# Patient Record
Sex: Male | Born: 1935 | Race: Black or African American | Hispanic: No | Smoking: Never smoker
Health system: Southern US, Community
[De-identification: ages and names within clinical notes are randomized; demographics above are authoritative.]

## PROBLEM LIST (undated history)

## (undated) DIAGNOSIS — C189 Malignant neoplasm of colon, unspecified: Secondary | ICD-10-CM

## (undated) DIAGNOSIS — E7800 Pure hypercholesterolemia, unspecified: Secondary | ICD-10-CM

## (undated) DIAGNOSIS — M1990 Unspecified osteoarthritis, unspecified site: Secondary | ICD-10-CM

## (undated) DIAGNOSIS — I10 Essential (primary) hypertension: Secondary | ICD-10-CM

## (undated) DIAGNOSIS — I2699 Other pulmonary embolism without acute cor pulmonale: Secondary | ICD-10-CM

## (undated) DIAGNOSIS — M889 Osteitis deformans of unspecified bone: Secondary | ICD-10-CM

## (undated) HISTORY — DX: Malignant neoplasm of colon, unspecified: C18.9

## (undated) HISTORY — PX: APPENDECTOMY: SHX54

## (undated) HISTORY — DX: Osteitis deformans of unspecified bone: M88.9

## (undated) HISTORY — PX: KELOID EXCISION: SHX1856

## (undated) HISTORY — DX: Essential (primary) hypertension: I10

## (undated) HISTORY — DX: Other pulmonary embolism without acute cor pulmonale: I26.99

## (undated) HISTORY — DX: Pure hypercholesterolemia, unspecified: E78.00

---

## 1992-12-09 HISTORY — PX: HEMORRHOID SURGERY: SHX153

## 2000-03-27 ENCOUNTER — Ambulatory Visit (HOSPITAL_COMMUNITY): Admission: RE | Admit: 2000-03-27 | Discharge: 2000-03-27 | Payer: Self-pay | Admitting: Surgery

## 2000-03-27 ENCOUNTER — Encounter: Payer: Self-pay | Admitting: Surgery

## 2000-08-21 ENCOUNTER — Ambulatory Visit (HOSPITAL_COMMUNITY): Admission: RE | Admit: 2000-08-21 | Discharge: 2000-08-21 | Payer: Self-pay | Admitting: Surgery

## 2000-08-21 ENCOUNTER — Encounter: Payer: Self-pay | Admitting: Surgery

## 2001-02-19 ENCOUNTER — Encounter: Payer: Self-pay | Admitting: Surgery

## 2001-02-19 ENCOUNTER — Ambulatory Visit (HOSPITAL_COMMUNITY): Admission: RE | Admit: 2001-02-19 | Discharge: 2001-02-19 | Payer: Self-pay | Admitting: Surgery

## 2010-06-14 ENCOUNTER — Ambulatory Visit (HOSPITAL_COMMUNITY): Admission: RE | Admit: 2010-06-14 | Discharge: 2010-06-14 | Payer: Self-pay | Admitting: Gastroenterology

## 2010-06-27 ENCOUNTER — Encounter: Admission: RE | Admit: 2010-06-27 | Discharge: 2010-06-27 | Payer: Self-pay | Admitting: General Surgery

## 2010-07-03 ENCOUNTER — Inpatient Hospital Stay (HOSPITAL_COMMUNITY): Admission: RE | Admit: 2010-07-03 | Discharge: 2010-07-11 | Payer: Self-pay | Admitting: General Surgery

## 2010-07-03 ENCOUNTER — Encounter (INDEPENDENT_AMBULATORY_CARE_PROVIDER_SITE_OTHER): Payer: Self-pay | Admitting: General Surgery

## 2010-07-03 HISTORY — PX: LEFT COLECTOMY: SHX856

## 2010-07-24 ENCOUNTER — Ambulatory Visit: Payer: Self-pay | Admitting: Hematology and Oncology

## 2010-07-27 ENCOUNTER — Inpatient Hospital Stay (HOSPITAL_COMMUNITY): Admission: EM | Admit: 2010-07-27 | Discharge: 2010-08-03 | Payer: Self-pay | Admitting: Emergency Medicine

## 2010-07-27 ENCOUNTER — Ambulatory Visit: Payer: Self-pay | Admitting: Internal Medicine

## 2010-08-17 LAB — COMPREHENSIVE METABOLIC PANEL
ALT: 18 U/L (ref 0–53)
AST: 18 U/L (ref 0–37)
Albumin: 4.1 g/dL (ref 3.5–5.2)
BUN: 10 mg/dL (ref 6–23)
CO2: 30 mEq/L (ref 19–32)
Calcium: 10.3 mg/dL (ref 8.4–10.5)
Chloride: 105 mEq/L (ref 96–112)
Potassium: 4.7 mEq/L (ref 3.5–5.3)
Total Protein: 7.4 g/dL (ref 6.0–8.3)

## 2010-08-17 LAB — CBC WITH DIFFERENTIAL/PLATELET
Basophils Absolute: 0 10*3/uL (ref 0.0–0.1)
EOS%: 1.2 % (ref 0.0–7.0)
HCT: 34.9 % — ABNORMAL LOW (ref 38.4–49.9)
LYMPH%: 29.7 % (ref 14.0–49.0)
NEUT#: 3.5 10*3/uL (ref 1.5–6.5)
NEUT%: 60 % (ref 39.0–75.0)
RBC: 3.97 10*6/uL — ABNORMAL LOW (ref 4.20–5.82)
RDW: 17.5 % — ABNORMAL HIGH (ref 11.0–14.6)
WBC: 5.9 10*3/uL (ref 4.0–10.3)
lymph#: 1.7 10*3/uL (ref 0.9–3.3)

## 2010-08-17 LAB — CEA: CEA: <0.5 ng/mL (ref 0.0–5.0)

## 2010-08-20 ENCOUNTER — Ambulatory Visit (HOSPITAL_COMMUNITY): Admission: RE | Admit: 2010-08-20 | Discharge: 2010-08-20 | Payer: Self-pay | Admitting: Hematology and Oncology

## 2010-09-13 ENCOUNTER — Ambulatory Visit (HOSPITAL_COMMUNITY): Admission: RE | Admit: 2010-09-13 | Discharge: 2010-09-13 | Payer: Self-pay | Admitting: Gastroenterology

## 2010-10-31 ENCOUNTER — Ambulatory Visit: Payer: Self-pay | Admitting: Hematology and Oncology

## 2010-11-02 ENCOUNTER — Ambulatory Visit (HOSPITAL_COMMUNITY)
Admission: RE | Admit: 2010-11-02 | Discharge: 2010-11-02 | Payer: Self-pay | Source: Home / Self Care | Admitting: Hematology and Oncology

## 2010-11-02 LAB — CBC WITH DIFFERENTIAL/PLATELET
Basophils Absolute: 0 10*3/uL (ref 0.0–0.1)
Eosinophils Absolute: 0 10*3/uL (ref 0.0–0.5)
HGB: 11.7 g/dL — ABNORMAL LOW (ref 13.0–17.1)
MCH: 27.9 pg (ref 27.2–33.4)
MCV: 84.3 fL (ref 79.3–98.0)
MONO#: 0.5 10*3/uL (ref 0.1–0.9)
NEUT#: 2.3 10*3/uL (ref 1.5–6.5)
Platelets: 252 10*3/uL (ref 140–400)

## 2010-11-02 LAB — COMPREHENSIVE METABOLIC PANEL WITH GFR
ALT: 17 U/L (ref 0–53)
AST: 19 U/L (ref 0–37)
Albumin: 4.3 g/dL (ref 3.5–5.2)
Alkaline Phosphatase: 144 U/L — ABNORMAL HIGH (ref 39–117)
BUN: 12 mg/dL (ref 6–23)
CO2: 28 meq/L (ref 19–32)
Calcium: 10 mg/dL (ref 8.4–10.5)
Chloride: 102 meq/L (ref 96–112)
Creatinine, Ser: 1 mg/dL (ref 0.40–1.50)
Glucose, Bld: 91 mg/dL (ref 70–99)
Potassium: 4.5 meq/L (ref 3.5–5.3)
Sodium: 140 meq/L (ref 135–145)
Total Bilirubin: 0.6 mg/dL (ref 0.3–1.2)
Total Protein: 7.5 g/dL (ref 6.0–8.3)

## 2010-11-02 LAB — CEA: CEA: <0.5 ng/mL (ref 0.0–5.0)

## 2010-11-13 LAB — SPEP & IFE WITH QIG
Alpha-2-Globulin: 8 % (ref 7.1–11.8)
Gamma Globulin: 13.7 % (ref 11.1–18.8)
IgA: 395 mg/dL — ABNORMAL HIGH (ref 68–378)
IgM, Serum: 48 mg/dL — ABNORMAL LOW (ref 60–263)

## 2010-11-15 LAB — KAPPA/LAMBDA LIGHT CHAINS: Kappa:Lambda Ratio: 0.51 (ref 0.26–1.65)

## 2010-11-23 ENCOUNTER — Ambulatory Visit (HOSPITAL_COMMUNITY)
Admission: RE | Admit: 2010-11-23 | Discharge: 2010-11-23 | Payer: Self-pay | Source: Home / Self Care | Attending: Hematology and Oncology | Admitting: Hematology and Oncology

## 2010-12-26 ENCOUNTER — Ambulatory Visit: Payer: Self-pay | Admitting: Hematology and Oncology

## 2010-12-28 ENCOUNTER — Ambulatory Visit (HOSPITAL_COMMUNITY)
Admission: RE | Admit: 2010-12-28 | Discharge: 2010-12-28 | Payer: Self-pay | Source: Home / Self Care | Attending: Hematology and Oncology | Admitting: Hematology and Oncology

## 2010-12-31 LAB — CREATININE, SERUM
Creatinine, Ser: 0.99 mg/dL (ref 0.4–1.5)
GFR calc Af Amer: >60 mL/min (ref >60)

## 2011-02-22 LAB — PROTIME-INR
INR: 1.47 (ref 0.00–1.49)
INR: 2.31 — ABNORMAL HIGH (ref 0.00–1.49)
Prothrombin Time: 15.5 seconds — ABNORMAL HIGH (ref 11.6–15.2)
Prothrombin Time: 16.1 seconds — ABNORMAL HIGH (ref 11.6–15.2)
Prothrombin Time: 18 seconds — ABNORMAL HIGH (ref 11.6–15.2)

## 2011-02-22 LAB — DIFFERENTIAL
Basophils Absolute: 0 10*3/uL (ref 0.0–0.1)
Basophils Relative: 0 % (ref 0–1)
Eosinophils Relative: 0 % (ref 0–5)
Lymphocytes Relative: 10 % — ABNORMAL LOW (ref 12–46)
Monocytes Relative: 8 % (ref 3–12)
Neutrophils Relative %: 82 % — ABNORMAL HIGH (ref 43–77)

## 2011-02-22 LAB — CULTURE, BLOOD (ROUTINE X 2)
Culture: NO GROWTH
Culture: NO GROWTH

## 2011-02-22 LAB — COMPREHENSIVE METABOLIC PANEL
ALT: 22 U/L (ref 0–53)
AST: 22 U/L (ref 0–37)
Albumin: 3.9 g/dL (ref 3.5–5.2)
Alkaline Phosphatase: 138 U/L — ABNORMAL HIGH (ref 39–117)
GFR calc Af Amer: 55 mL/min — ABNORMAL LOW (ref >60)
GFR calc non Af Amer: 45 mL/min — ABNORMAL LOW (ref >60)
Potassium: 4.4 mEq/L (ref 3.5–5.1)
Total Bilirubin: 1.2 mg/dL (ref 0.3–1.2)

## 2011-02-22 LAB — CBC
HCT: 23.6 % — ABNORMAL LOW (ref 39.0–52.0)
Hemoglobin: 10.5 g/dL — ABNORMAL LOW (ref 13.0–17.0)
Hemoglobin: 11.3 g/dL — ABNORMAL LOW (ref 13.0–17.0)
Hemoglobin: 7.9 g/dL — ABNORMAL LOW (ref 13.0–17.0)
MCH: 29.3 pg (ref 26.0–34.0)
MCH: 29.6 pg (ref 26.0–34.0)
MCV: 89.4 fL (ref 78.0–100.0)
Platelets: 295 10*3/uL (ref 150–400)
Platelets: 315 10*3/uL (ref 150–400)
RBC: 3.79 MIL/uL — ABNORMAL LOW (ref 4.22–5.81)
RDW: 16.2 % — ABNORMAL HIGH (ref 11.5–15.5)
WBC: 11.3 10*3/uL — ABNORMAL HIGH (ref 4.0–10.5)
WBC: 13.2 10*3/uL — ABNORMAL HIGH (ref 4.0–10.5)
WBC: 6.2 10*3/uL (ref 4.0–10.5)

## 2011-02-22 LAB — LACTIC ACID, PLASMA: Lactic Acid, Venous: 1.1 mmol/L (ref 0.5–2.2)

## 2011-02-22 LAB — BASIC METABOLIC PANEL
Calcium: 9.9 mg/dL (ref 8.4–10.5)
Chloride: 102 mEq/L (ref 96–112)
Creatinine, Ser: 1.24 mg/dL (ref 0.4–1.5)
GFR calc Af Amer: >60 mL/min (ref >60)
GFR calc non Af Amer: 57 mL/min — ABNORMAL LOW (ref >60)
Potassium: 4.4 mEq/L (ref 3.5–5.1)
Sodium: 137 mEq/L (ref 135–145)

## 2011-02-23 LAB — CBC
HCT: 25.9 % — ABNORMAL LOW (ref 39.0–52.0)
HCT: 27.4 % — ABNORMAL LOW (ref 39.0–52.0)
Hemoglobin: 11.2 g/dL — ABNORMAL LOW (ref 13.0–17.0)
Hemoglobin: 8.5 g/dL — ABNORMAL LOW (ref 13.0–17.0)
Hemoglobin: 9.2 g/dL — ABNORMAL LOW (ref 13.0–17.0)
MCH: 30 pg (ref 26.0–34.0)
MCH: 30.3 pg (ref 26.0–34.0)
MCHC: 33 g/dL (ref 30.0–36.0)
MCHC: 33.4 g/dL (ref 30.0–36.0)
MCV: 88.9 fL (ref 78.0–100.0)
MCV: 90.6 fL (ref 78.0–100.0)
Platelets: 250 10*3/uL (ref 150–400)
Platelets: 272 10*3/uL (ref 150–400)
RBC: 2.86 MIL/uL — ABNORMAL LOW (ref 4.22–5.81)
RBC: 3.2 MIL/uL — ABNORMAL LOW (ref 4.22–5.81)
RBC: 3.75 MIL/uL — ABNORMAL LOW (ref 4.22–5.81)
RDW: 14.9 % (ref 11.5–15.5)
RDW: 15.1 % (ref 11.5–15.5)
RDW: 15.1 % (ref 11.5–15.5)
WBC: 11.1 10*3/uL — ABNORMAL HIGH (ref 4.0–10.5)
WBC: 11.4 10*3/uL — ABNORMAL HIGH (ref 4.0–10.5)
WBC: 12.6 10*3/uL — ABNORMAL HIGH (ref 4.0–10.5)

## 2011-02-23 LAB — DIFFERENTIAL
Basophils Absolute: 0 10*3/uL (ref 0.0–0.1)
Basophils Absolute: 0.1 10*3/uL (ref 0.0–0.1)
Basophils Relative: 1 % (ref 0–1)
Basophils Relative: 1 % (ref 0–1)
Eosinophils Absolute: 0 10*3/uL (ref 0.0–0.7)
Eosinophils Absolute: 0.1 10*3/uL (ref 0.0–0.7)
Eosinophils Relative: 0 % (ref 0–5)
Eosinophils Relative: 2 % (ref 0–5)
Monocytes Absolute: 0.5 10*3/uL (ref 0.1–1.0)
Neutro Abs: 3.5 10*3/uL (ref 1.7–7.7)

## 2011-02-23 LAB — COMPREHENSIVE METABOLIC PANEL
Albumin: 4 g/dL (ref 3.5–5.2)
Alkaline Phosphatase: 158 U/L — ABNORMAL HIGH (ref 39–117)
BUN: 16 mg/dL (ref 6–23)
Chloride: 103 mEq/L (ref 96–112)
Glucose, Bld: 103 mg/dL — ABNORMAL HIGH (ref 70–99)
Potassium: 4.3 mEq/L (ref 3.5–5.1)
Total Bilirubin: 0.4 mg/dL (ref 0.3–1.2)

## 2011-02-23 LAB — BASIC METABOLIC PANEL
BUN: 8 mg/dL (ref 6–23)
CO2: 29 mEq/L (ref 19–32)
Calcium: 9.1 mg/dL (ref 8.4–10.5)
Calcium: 9.6 mg/dL (ref 8.4–10.5)
Creatinine, Ser: 1.23 mg/dL (ref 0.4–1.5)
GFR calc Af Amer: >60 mL/min (ref >60)
GFR calc Af Amer: >60 mL/min (ref >60)
GFR calc non Af Amer: >60 mL/min (ref >60)
Glucose, Bld: 130 mg/dL — ABNORMAL HIGH (ref 70–99)
Potassium: 4.2 mEq/L (ref 3.5–5.1)
Sodium: 139 mEq/L (ref 135–145)

## 2011-02-23 LAB — CEA: CEA: 0.8 ng/mL (ref 0.0–5.0)

## 2011-06-28 ENCOUNTER — Other Ambulatory Visit: Payer: Self-pay | Admitting: Hematology and Oncology

## 2011-06-28 ENCOUNTER — Encounter (HOSPITAL_BASED_OUTPATIENT_CLINIC_OR_DEPARTMENT_OTHER): Payer: Medicare Other | Admitting: Hematology and Oncology

## 2011-06-28 DIAGNOSIS — Z86718 Personal history of other venous thrombosis and embolism: Secondary | ICD-10-CM

## 2011-06-28 DIAGNOSIS — C189 Malignant neoplasm of colon, unspecified: Secondary | ICD-10-CM

## 2011-06-28 DIAGNOSIS — C186 Malignant neoplasm of descending colon: Secondary | ICD-10-CM

## 2011-06-28 DIAGNOSIS — Z7901 Long term (current) use of anticoagulants: Secondary | ICD-10-CM

## 2011-06-28 LAB — CBC WITH DIFFERENTIAL/PLATELET
Basophils Absolute: 0 10*3/uL (ref 0.0–0.1)
Eosinophils Absolute: 0 10*3/uL (ref 0.0–0.5)
HCT: 40.6 % (ref 38.4–49.9)
LYMPH%: 31.6 % (ref 14.0–49.0)
MCV: 87.3 fL (ref 79.3–98.0)
MONO#: 0.5 10*3/uL (ref 0.1–0.9)
MONO%: 6.6 % (ref 0.0–14.0)
NEUT#: 4.2 10*3/uL (ref 1.5–6.5)
NEUT%: 61.1 % (ref 39.0–75.0)
Platelets: 217 10*3/uL (ref 140–400)
RBC: 4.65 10*6/uL (ref 4.20–5.82)
WBC: 6.8 10*3/uL (ref 4.0–10.3)

## 2011-06-28 LAB — COMPREHENSIVE METABOLIC PANEL
Alkaline Phosphatase: 329 U/L — ABNORMAL HIGH (ref 39–117)
BUN: 12 mg/dL (ref 6–23)
CO2: 27 mEq/L (ref 19–32)
Creatinine, Ser: 1.04 mg/dL (ref 0.50–1.35)
Glucose, Bld: 118 mg/dL — ABNORMAL HIGH (ref 70–99)
Sodium: 143 mEq/L (ref 135–145)
Total Bilirubin: 0.3 mg/dL (ref 0.3–1.2)
Total Protein: 7.4 g/dL (ref 6.0–8.3)

## 2011-06-28 LAB — CEA: CEA: 1 ng/mL (ref 0.0–5.0)

## 2011-07-03 ENCOUNTER — Encounter (HOSPITAL_BASED_OUTPATIENT_CLINIC_OR_DEPARTMENT_OTHER): Payer: Medicare Other | Admitting: Hematology and Oncology

## 2011-07-03 DIAGNOSIS — M889 Osteitis deformans of unspecified bone: Secondary | ICD-10-CM

## 2011-07-03 DIAGNOSIS — D481 Neoplasm of uncertain behavior of connective and other soft tissue: Secondary | ICD-10-CM

## 2011-07-03 DIAGNOSIS — C186 Malignant neoplasm of descending colon: Secondary | ICD-10-CM

## 2011-12-07 IMAGING — CT CT ABD-PELV W/ CM
2 of 5 series · 13 of 32 positions shown, 18 images · IV contrast (READICAT & [ID] OMNI 300)
Comparison: None.

CLINICAL DATA: New diagnosis of descending colon cancer recent
colonoscopy.  Weight loss.

CT ABDOMEN AND PELVIS WITH CONTRAST
TECHNIQUE: Multidetector CT imaging of the abdomen and pelvis was
performed following the standard protocol during bolus
administration of intravenous contrast.
Contrast: 100 ml Emnipaque-R55 intravenously. BUN and creatinine
were obtained on site.  Results:  BUN 20 mg/dL,  Creatinine
mg/dL.

[Series 2: abdomen w/ · axial · 0.74mm/px · z∈[-244,+16]mm · 5 of 79 slices shown, 10 images]
[im 14/79  soft-tissue]
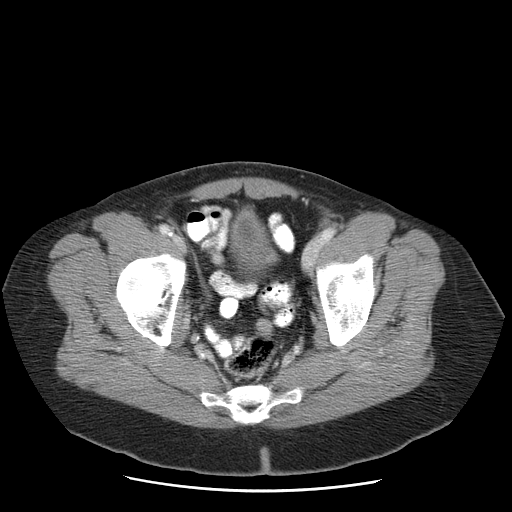
[im 14/79  bone]
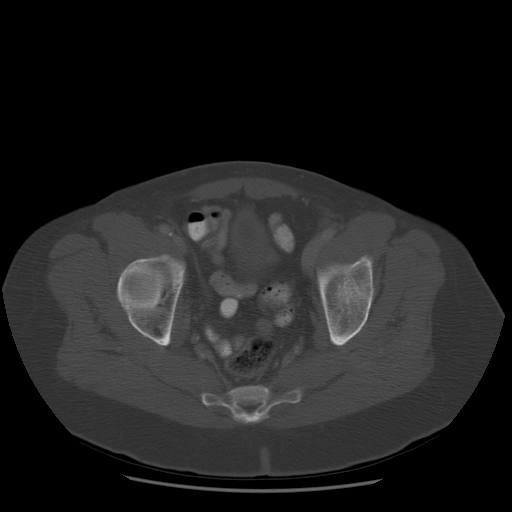
[im 27/79  soft-tissue]
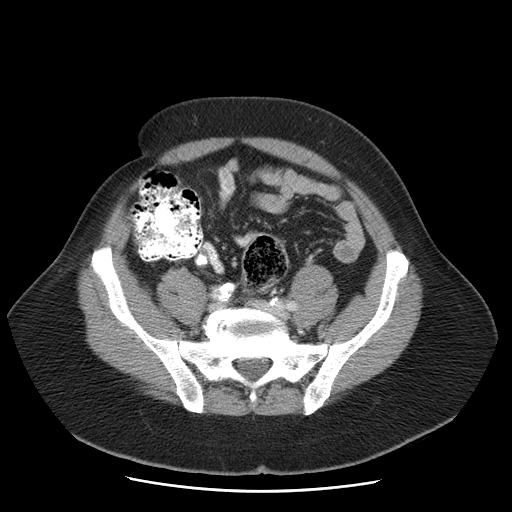
[im 27/79  lung]
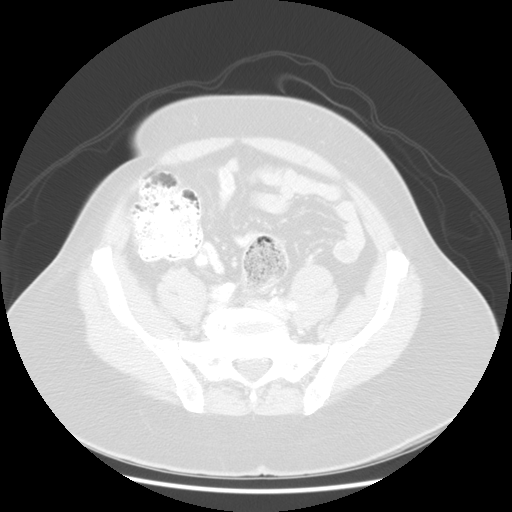
[im 40/79  soft-tissue]
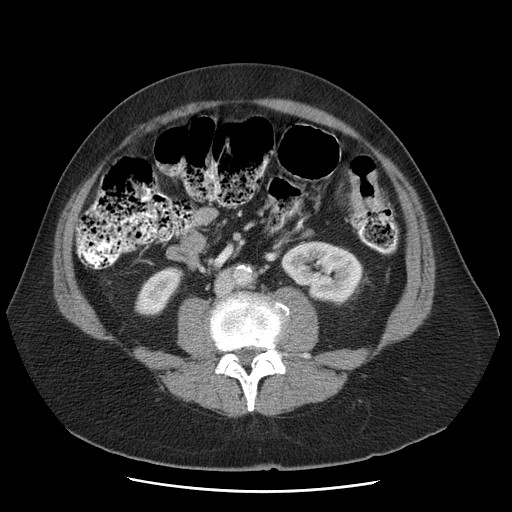
[im 40/79  lung]
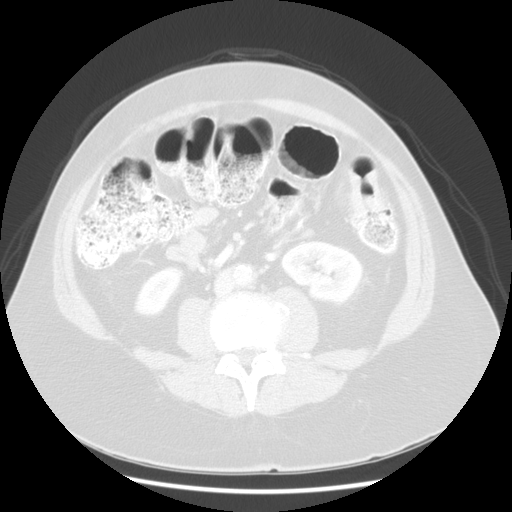
[im 53/79  soft-tissue]
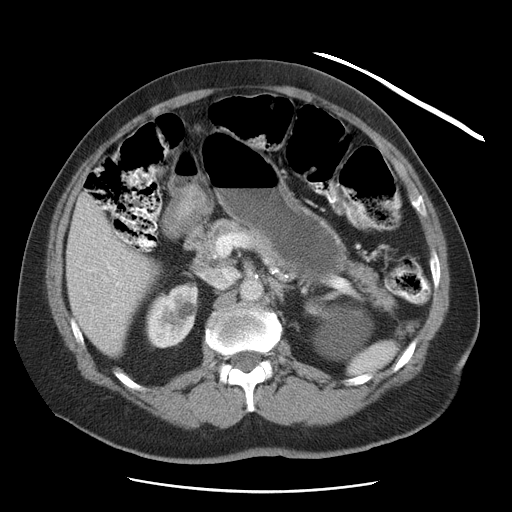
[im 53/79  lung]
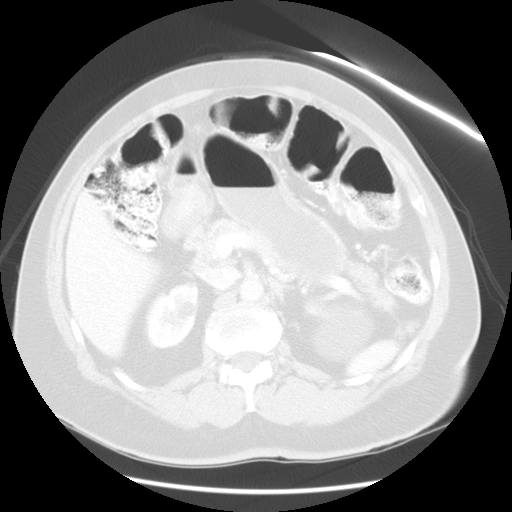
[im 66/79  soft-tissue]
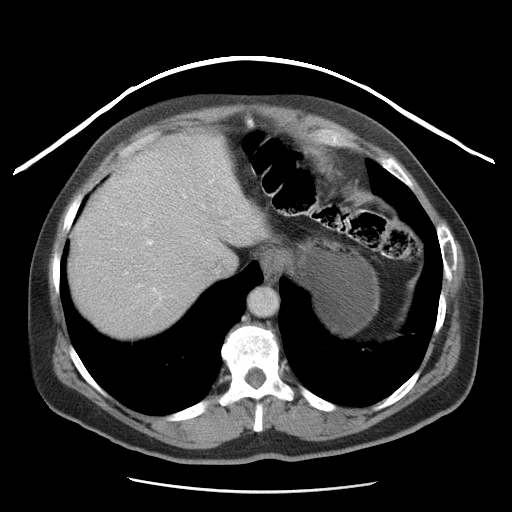
[im 66/79  lung]
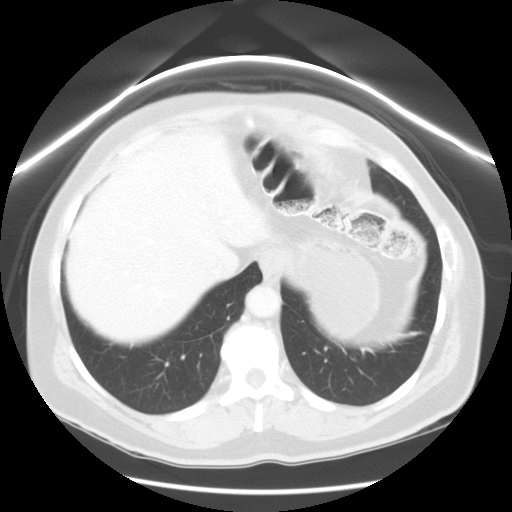

[Series 400: sagittal · sagittal · 0.88mm/px · 8 of 146 slices shown]
[im 15/146  soft-tissue]
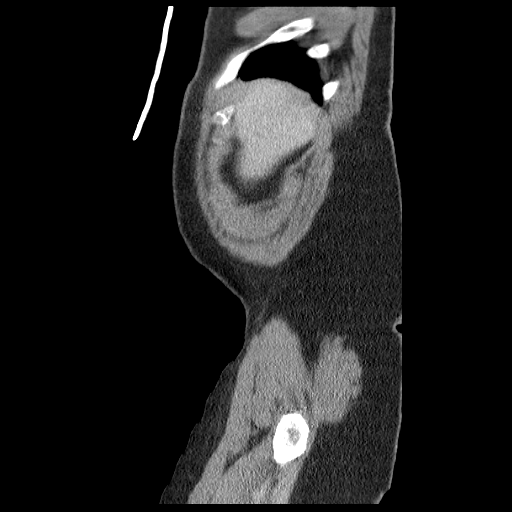
[im 30/146  soft-tissue]
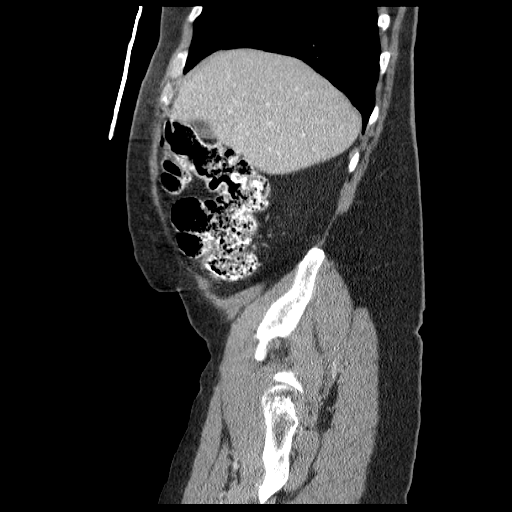
[im 44/146  soft-tissue]
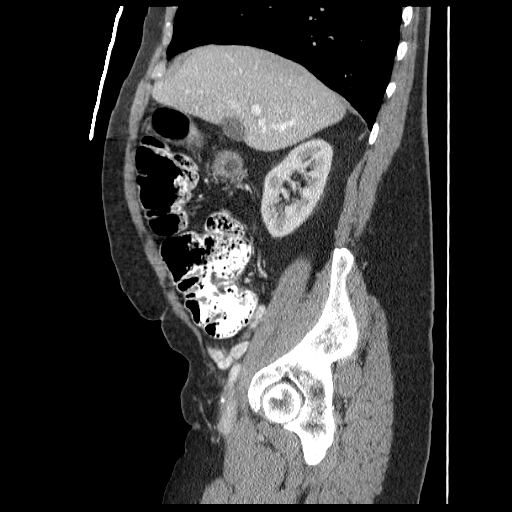
[im 59/146  soft-tissue]
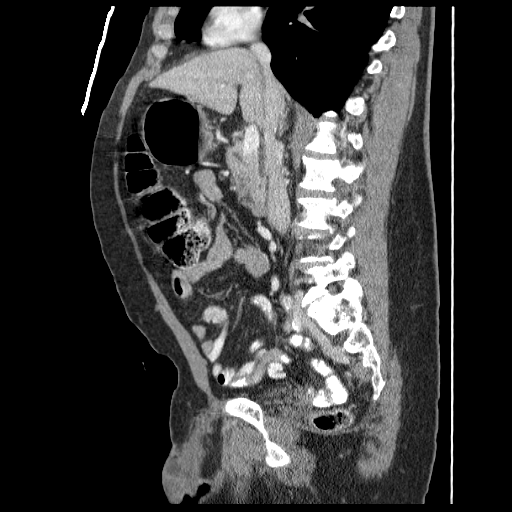
[im 88/146  soft-tissue]
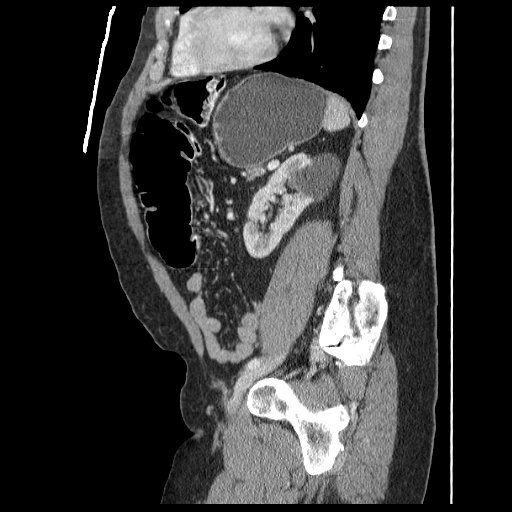
[im 102/146  soft-tissue]
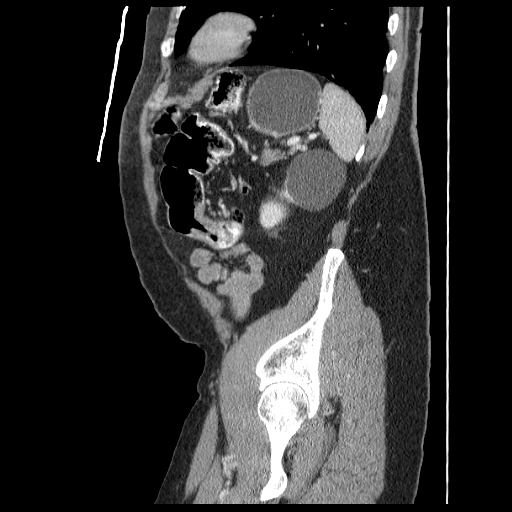
[im 117/146  soft-tissue]
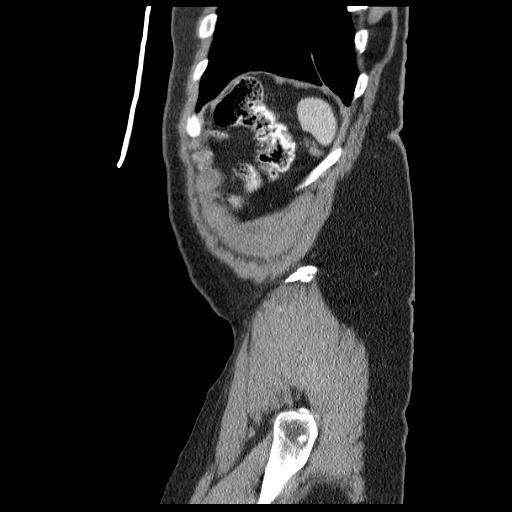
[im 131/146  soft-tissue]
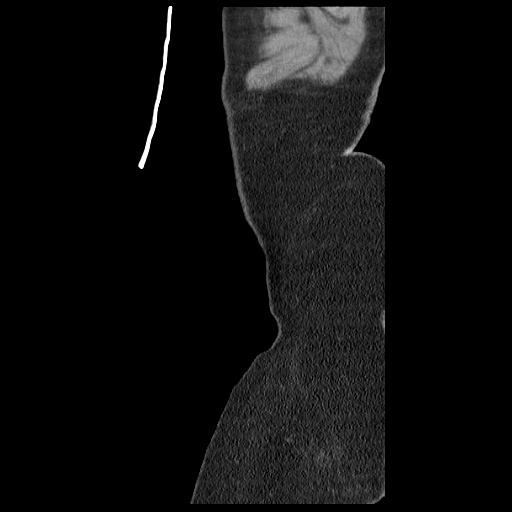

[13 of 32 positions shown; findings below may reference images not displayed]

FINDINGS: The lung bases are clear.  There is no pleural effusion.
The liver, spleen, gallbladder, pancreas and adrenal glands appear
normal.

There are bilateral renal cysts, measuring up to 6.0 cm in the
upper pole of the left kidney.  There are no enhancing renal
masses.

The colon is quite redundant.  The sigmoid colon extends into the
left upper quadrant of the abdomen.  The descending colon is
relatively short.  Near the junction of the descending and sigmoid
colon, there is circumferential colonic wall thickening consistent
with colon cancer.  This is seen on axial images 37 - 42.  Small
adjacent lymph nodes do not appear enlarged.  There is moderate
stool and mild distension proximal to this lesion.  No other focal
colonic lesions are identified.

There is possible gastric antral and proximal duodenal wall
thickening on images 26 - 29.  The ligament of Treitz is atypically
positioned in the right mid abdomen.  The cecum is normally
positioned.  There is no evidence of volvulus.

The urinary bladder demonstrates mild wall thickening.  The
prostate gland does not appear significantly enlarged.

There is diffuse irregular sclerosis throughout the lumbar spine
and sacrum with scattered lucencies in the spine.  The sacral
trabecula appear thickened.  There is a peripherally sclerotic
lesion in the left iliac bone on image 56.
IMPRESSION: 1.  Colon cancer near the junction of the descending and sigmoid
colon as described.
2.  No typical metastases are identified.  Specifically, there is
no evidence of hepatic metastatic disease or lymphadenopathy.
3.  Atypical mixed sclerosis and lucency in the spine with
trabecular thickening in the sacrum.  This would be atypical for
metastatic disease and may represent fibrous dysplasia or Paget's
disease.  Correlate clinically.
4.  Possible mucosal thickening at the gastric antrum and proximal
duodenum.  This is nonspecific in etiology.
5.  Redundant colon with apparent incomplete small bowel rotation.
6.  Bladder wall thickening.

## 2011-12-09 ENCOUNTER — Telehealth: Payer: Self-pay | Admitting: Hematology and Oncology

## 2011-12-09 ENCOUNTER — Other Ambulatory Visit: Payer: Self-pay | Admitting: Hematology and Oncology

## 2011-12-09 DIAGNOSIS — Z86718 Personal history of other venous thrombosis and embolism: Secondary | ICD-10-CM

## 2011-12-09 NOTE — Telephone Encounter (Signed)
Talked to pt, he asked me to call his daughter. Called [facility], left message for appt on 1/28 lab and Ct, MD visit on 01/10/12. Pt also instructed to pick up oral contrast before CT.

## 2011-12-31 ENCOUNTER — Telehealth: Payer: Self-pay | Admitting: Hematology and Oncology

## 2011-12-31 NOTE — Telephone Encounter (Signed)
pts daugter came by and p/u contrast for CT scan

## 2012-01-06 ENCOUNTER — Other Ambulatory Visit (HOSPITAL_BASED_OUTPATIENT_CLINIC_OR_DEPARTMENT_OTHER): Payer: Medicare Other | Admitting: Lab

## 2012-01-06 ENCOUNTER — Encounter: Payer: Self-pay | Admitting: Nurse Practitioner

## 2012-01-06 ENCOUNTER — Ambulatory Visit (HOSPITAL_COMMUNITY)
Admission: RE | Admit: 2012-01-06 | Discharge: 2012-01-06 | Disposition: A | Discharge status: Home / Self Care | Payer: Medicare Other | Source: Ambulatory Visit | Attending: Hematology and Oncology | Admitting: Hematology and Oncology

## 2012-01-06 DIAGNOSIS — K8689 Other specified diseases of pancreas: Secondary | ICD-10-CM | POA: Insufficient documentation

## 2012-01-06 DIAGNOSIS — R109 Unspecified abdominal pain: Secondary | ICD-10-CM | POA: Insufficient documentation

## 2012-01-06 DIAGNOSIS — Z9049 Acquired absence of other specified parts of digestive tract: Secondary | ICD-10-CM | POA: Insufficient documentation

## 2012-01-06 DIAGNOSIS — M899 Disorder of bone, unspecified: Secondary | ICD-10-CM | POA: Insufficient documentation

## 2012-01-06 DIAGNOSIS — C189 Malignant neoplasm of colon, unspecified: Secondary | ICD-10-CM | POA: Insufficient documentation

## 2012-01-06 DIAGNOSIS — N289 Disorder of kidney and ureter, unspecified: Secondary | ICD-10-CM | POA: Insufficient documentation

## 2012-01-06 DIAGNOSIS — N281 Cyst of kidney, acquired: Secondary | ICD-10-CM | POA: Insufficient documentation

## 2012-01-06 DIAGNOSIS — K439 Ventral hernia without obstruction or gangrene: Secondary | ICD-10-CM | POA: Insufficient documentation

## 2012-01-06 DIAGNOSIS — Z86718 Personal history of other venous thrombosis and embolism: Secondary | ICD-10-CM

## 2012-01-06 DIAGNOSIS — Z86711 Personal history of pulmonary embolism: Secondary | ICD-10-CM | POA: Insufficient documentation

## 2012-01-06 LAB — CMP (CANCER CENTER ONLY)
ALT(SGPT): 25 U/L (ref 10–47)
AST: 28 U/L (ref 11–38)
Albumin: 4.2 g/dL (ref 3.3–5.5)
Calcium: 9.9 mg/dL (ref 8.0–10.3)
Chloride: 101 mEq/L (ref 98–108)
Potassium: 3.8 mEq/L (ref 3.3–4.7)

## 2012-01-06 LAB — CBC WITH DIFFERENTIAL/PLATELET
BASO%: 0.3 % (ref 0.0–2.0)
Eosinophils Absolute: 0 10*3/uL (ref 0.0–0.5)
HCT: 44.8 % (ref 38.4–49.9)
MCHC: 34 g/dL (ref 32.0–36.0)
MONO#: 0.5 10*3/uL (ref 0.1–0.9)
NEUT#: 2.7 10*3/uL (ref 1.5–6.5)
NEUT%: 51.1 % (ref 39.0–75.0)
Platelets: 180 10*3/uL (ref 140–400)
WBC: 5.3 10*3/uL (ref 4.0–10.3)
lymph#: 2.1 10*3/uL (ref 0.9–3.3)

## 2012-01-06 IMAGING — CR DG CHEST 2V
2 series · 2 of 2 positions shown · non-contrast
Comparison: Chest radiograph performed 06/27/2010

CLINICAL DATA: Shortness of breath, status post colonic surgery.

CHEST - 2 VIEW

[w chest pa]
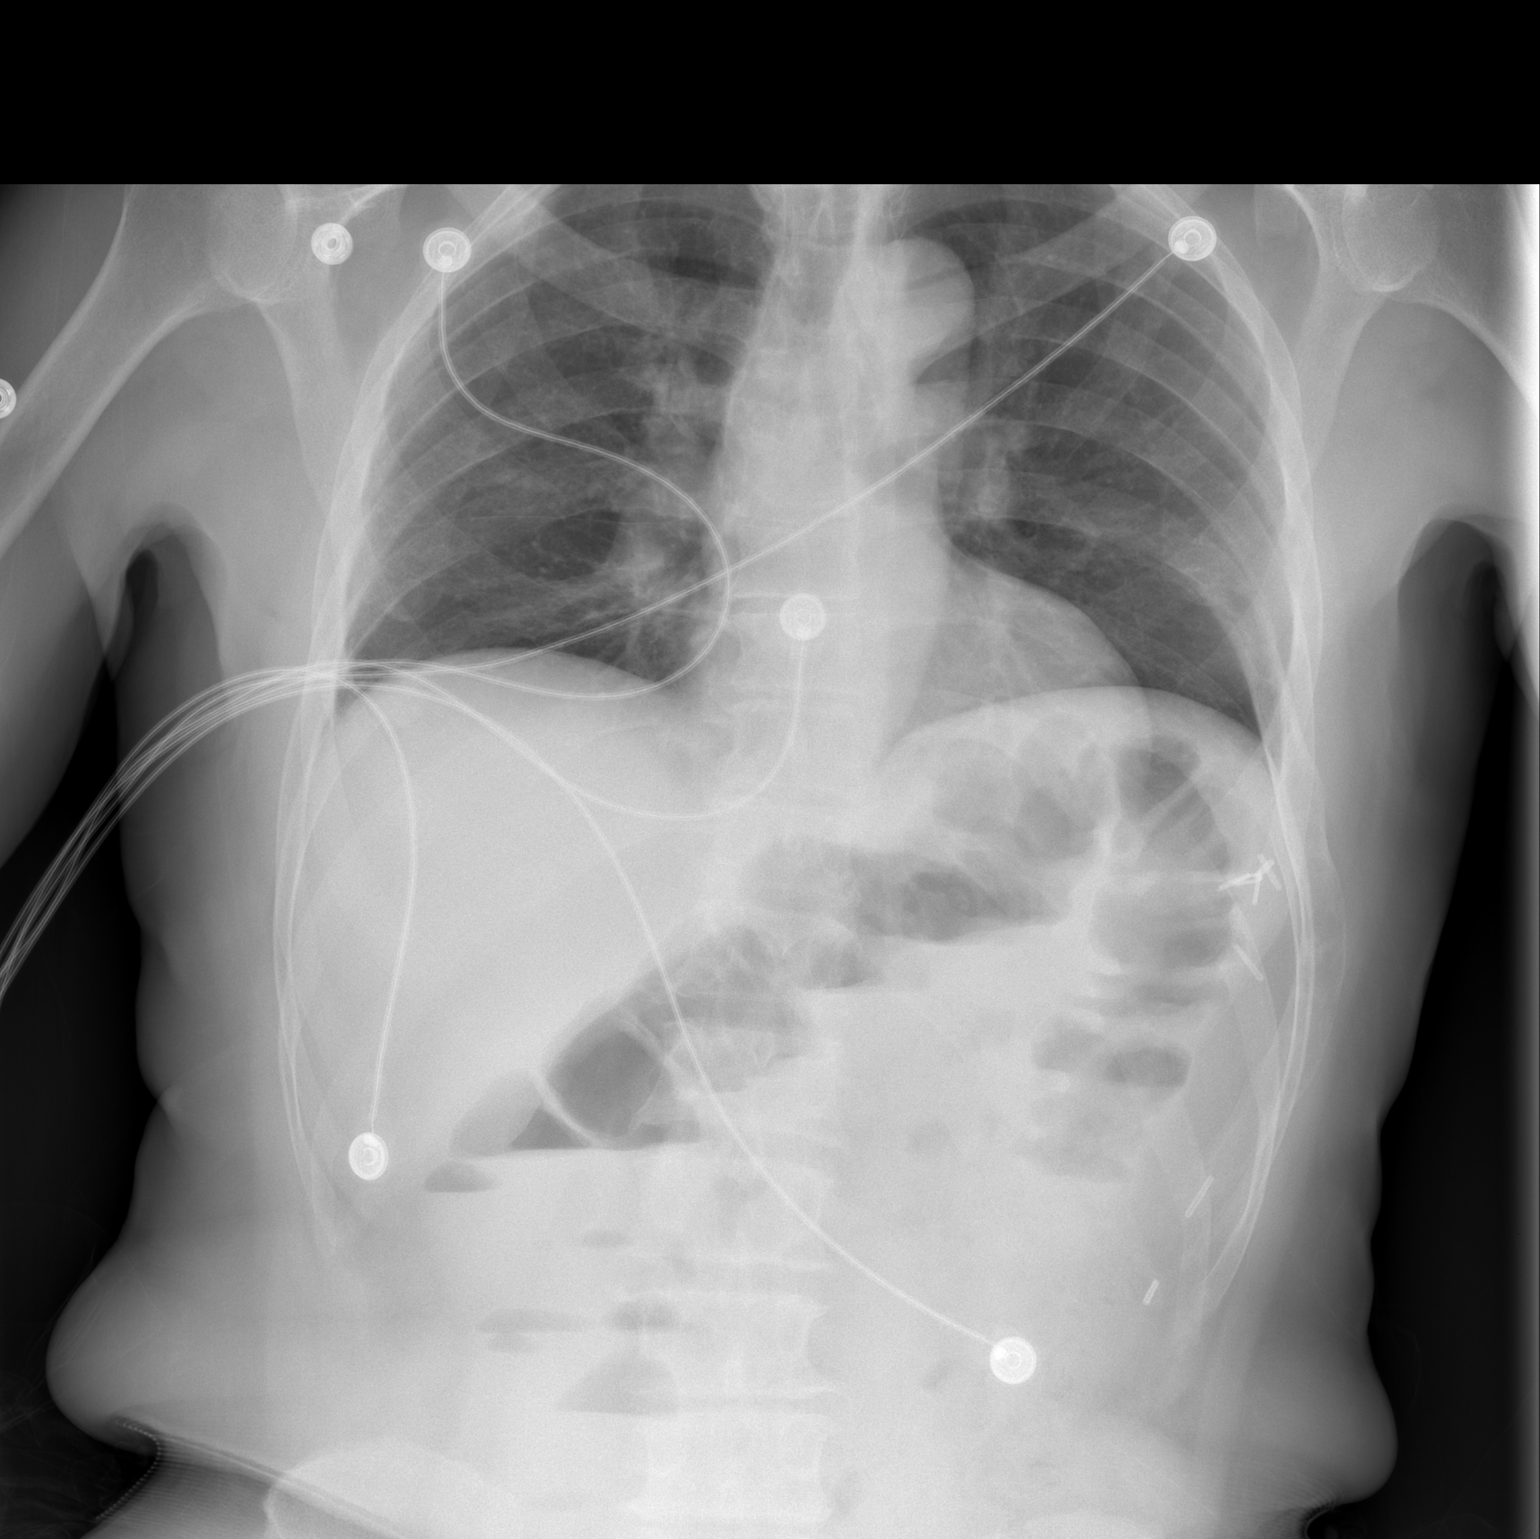

[w chest lat]
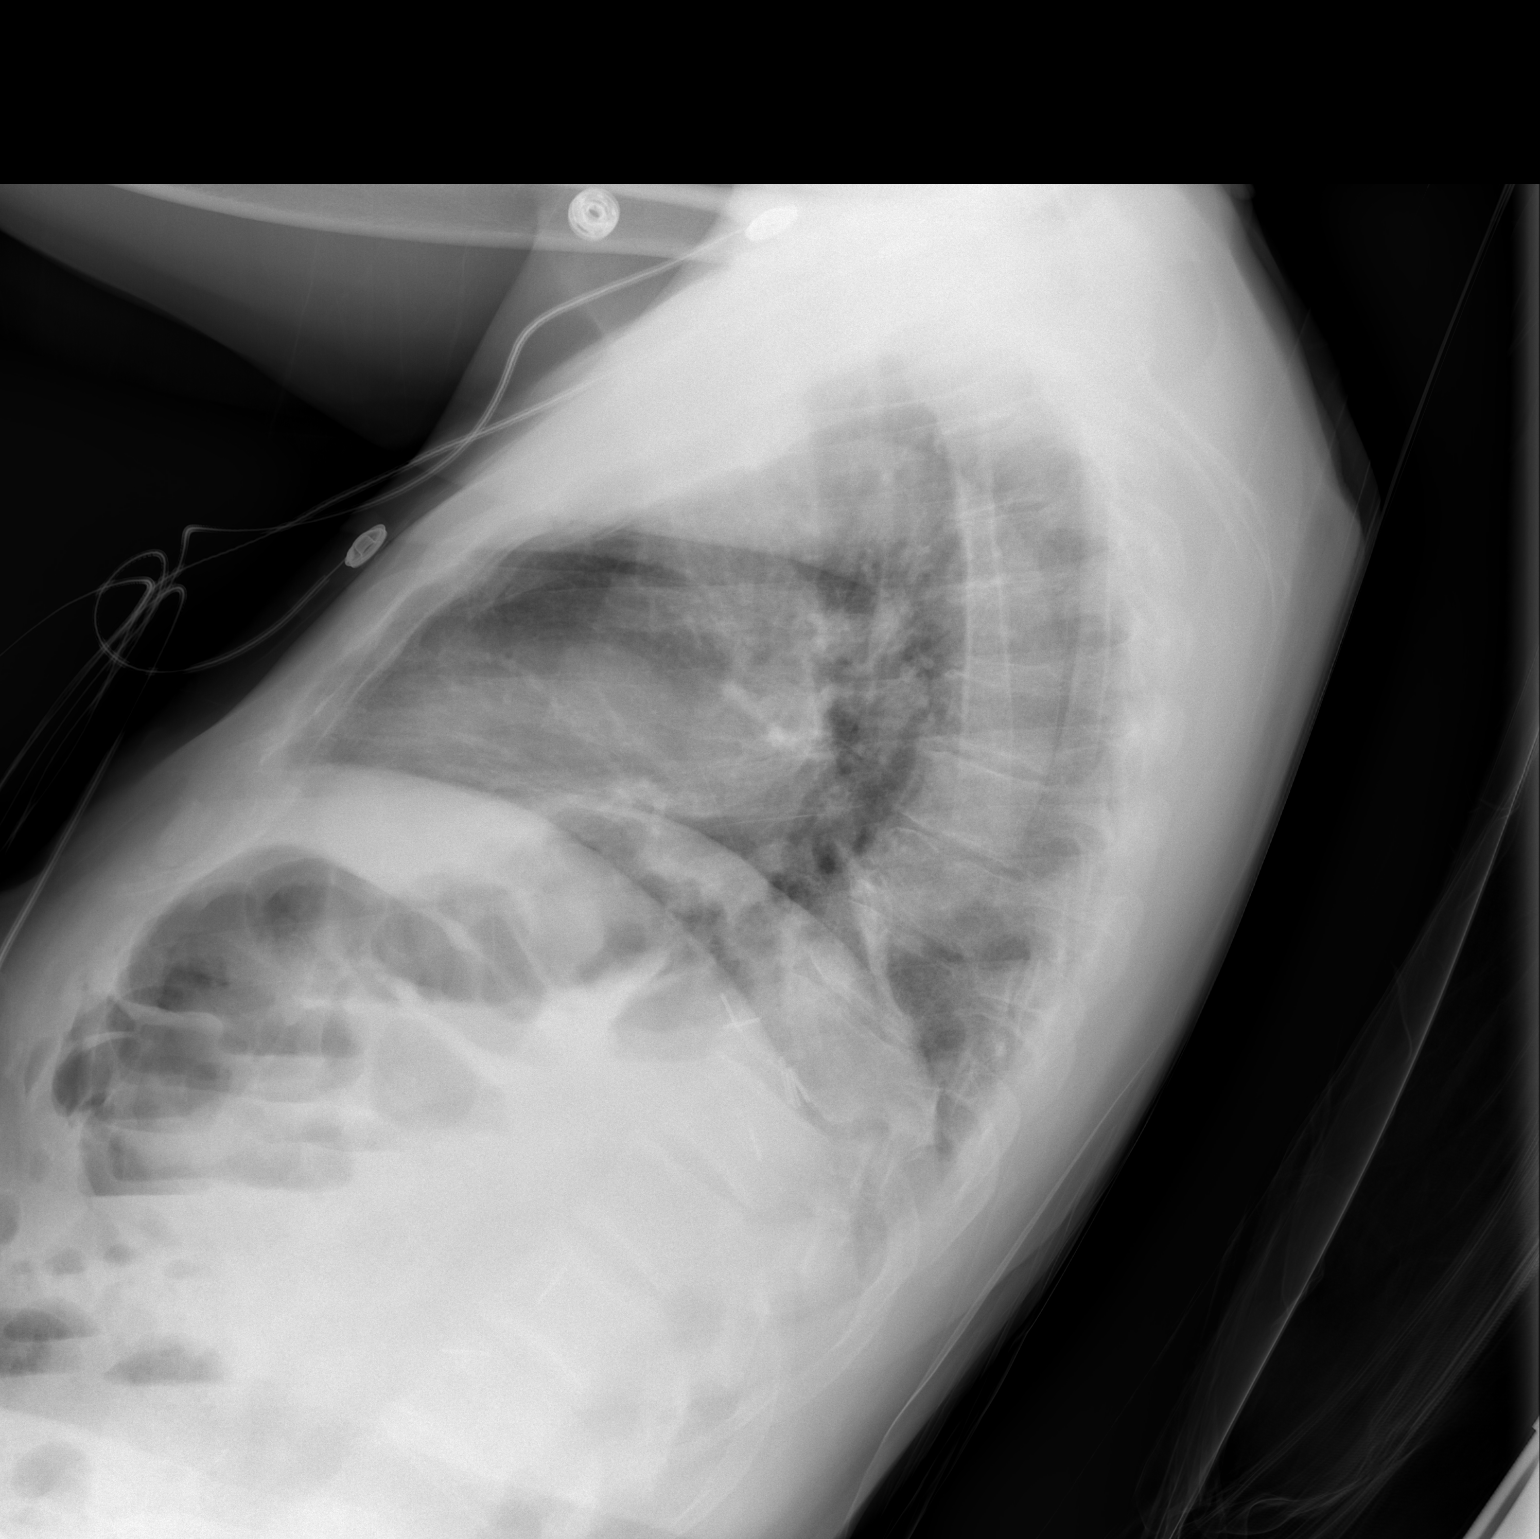

[2 of 2 positions shown; findings below may reference images not displayed]

FINDINGS: The lungs are hypoexpanded but appear essentially clear.
There is no evidence of focal opacification, pleural effusion or
pneumothorax.

The heart is normal in size; the mediastinal contour is within
normal limits.  No acute osseous abnormalities are seen. Clips are
noted on the left side of the abdomen.

Air and fluid are noted filling the colon; a few air-fluid levels
are noted, without evidence of bowel distension to suggest
obstruction.  This likely reflects mild postoperative dysmotility.
IMPRESSION: Hypoexpanded but clear lungs.

## 2012-01-06 MED ORDER — IOHEXOL 300 MG/ML  SOLN
100.0000 mL | Freq: Once | INTRAMUSCULAR | Status: AC | PRN
  Administered 2012-01-06: 100 mL via INTRAVENOUS

## 2012-01-10 ENCOUNTER — Telehealth: Payer: Self-pay | Admitting: Nurse Practitioner

## 2012-01-10 ENCOUNTER — Ambulatory Visit: Payer: Medicare Other | Admitting: Hematology and Oncology

## 2012-01-10 IMAGING — CR DG CHEST 1V PORT
1 series · 1 of 1 positions shown · non-contrast
Comparison: 07/27/2010.

CLINICAL DATA: Pulmonary embolus.  Shortness breath.

PORTABLE CHEST - 1 VIEW

[series 1]
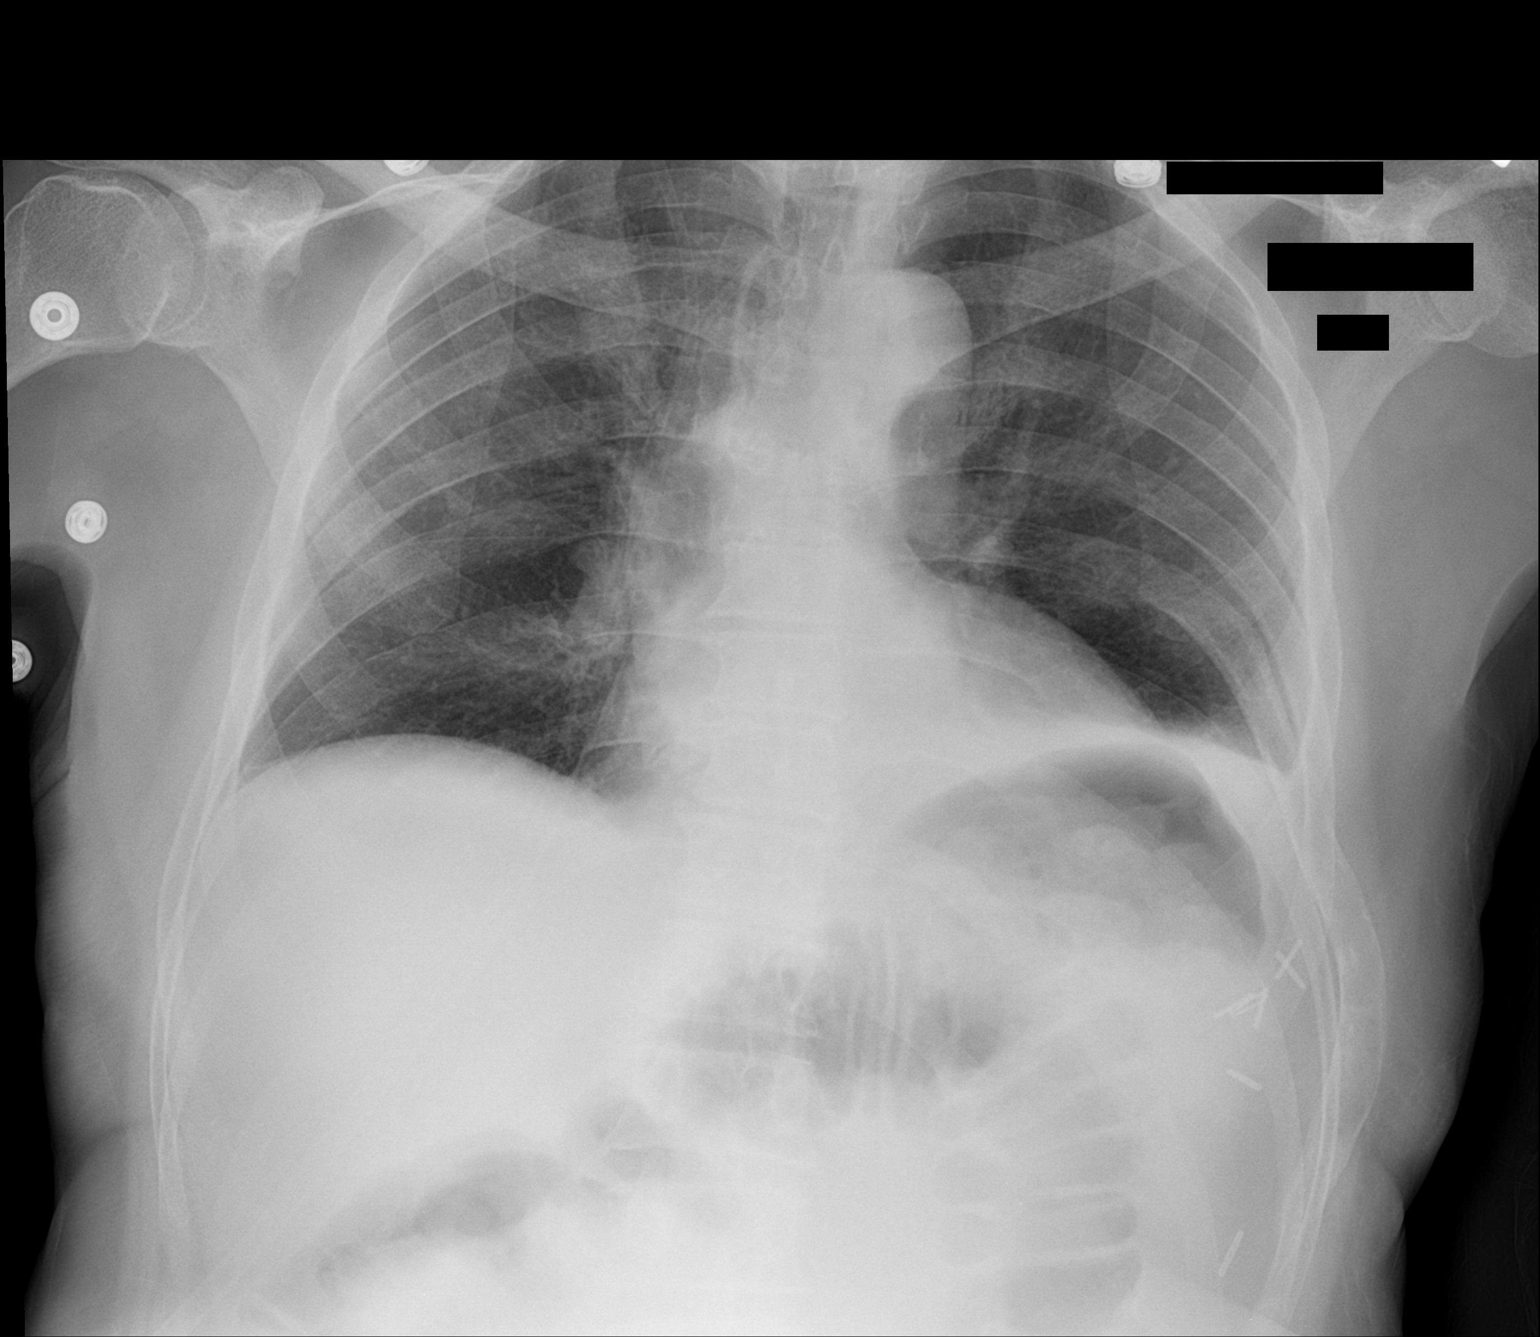

[1 of 1 positions shown; findings below may reference images not displayed]

FINDINGS: Persistent elevation left hemidiaphragm.  Small left-
sided pleural effusion cannot be excluded.

Mild central pulmonary vessel prominence.  No segmental infiltrate
or pneumothorax.  The CT detected region of parenchymal
infarction/atelectasis are not as well demonstrated on present
plain film exam.  Mildly tortuous aorta.

Gas distended bowel.
IMPRESSION: Persistent elevation left hemidiaphragm.  Small left-sided pleural
effusion cannot be excluded.

Mild central pulmonary vascular prominence.

Gas distended bowel.

## 2012-01-10 NOTE — Telephone Encounter (Signed)
Called patient to inform MD out of office today.  Requested return call to reschedule.

## 2012-01-10 NOTE — Telephone Encounter (Signed)
Spoke with patient's daughter.  R/S appt to Monday 2/4 at 0930.

## 2012-01-13 ENCOUNTER — Telehealth: Payer: Self-pay | Admitting: Hematology and Oncology

## 2012-01-13 ENCOUNTER — Ambulatory Visit (HOSPITAL_BASED_OUTPATIENT_CLINIC_OR_DEPARTMENT_OTHER): Payer: Medicare Other | Admitting: Hematology and Oncology

## 2012-01-13 VITALS — BP 158/85 | HR 65 | Temp 97.0°F | Ht 67.0 in | Wt 180.5 lb

## 2012-01-13 DIAGNOSIS — C189 Malignant neoplasm of colon, unspecified: Secondary | ICD-10-CM

## 2012-01-13 DIAGNOSIS — Z86711 Personal history of pulmonary embolism: Secondary | ICD-10-CM

## 2012-01-13 NOTE — Telephone Encounter (Signed)
appts made and printed for 07/21/12   aom

## 2012-01-13 NOTE — Progress Notes (Signed)
CC:   Pedro Palmer. Polite, M.D. Pedro Palmer. Pedro Palmer, M.D. Pedro Palmer, M.D.  IDENTIFYING STATEMENT:  The patient is a 78 year old man with colon cancer who presents for followup.  INTERVAL HISTORY:  Pedro Palmer was last seen in June.  He reports no concerns.  He notes his weight is stable.  He is without nausea, vomiting, abdominal pain, diarrhea or hematochezia.  His last colonoscopy was performed on 06/10/2011 by Dr. Danise Palmer which was unremarkable.  He received a CT scan of the abdomen and pelvis on 01/06/2012.  There was no acute evidence of metastatic disease within the abdomen and pelvis.  There was ventral abdominal wall hernia that contained no obstructive bowel.  Osseous findings consistent with the patient's known Paget's disease.  The patient continues on Coumadin for bilateral pulmonary emboli occurring following surgery.  MEDICATIONS:  Reviewed and updated.  ALLERGIES:  None.  Past medical history, family history and social history unchanged.  REVIEW OF SYSTEMS:  Ten point review of systems negative.  PHYSICAL EXAMINATION:  General:  The patient is a well-appearing, well- nourished man in no distress.  Vitals:  Pulse 65, blood pressure 150/85, temperature 97, respirations 20, weight 180 pounds.  HEENT:  Head is atraumatic, normocephalic.  Sclerae anicteric.  Mouth is moist.  Neck: Supple.  Chest:  Clear.  CVS:  Unremarkable.  Abdomen:  Soft, nontender. Bowel sounds present.  Extremities:  No edema.  No calf tenderness. Lymph nodes:  No adenopathy.  CNS:  Nonfocal.  LAB DATA:  On 01/06/2012, white cell count 5.3, hemoglobin 15.2, hematocrit 44.8, platelets 180.  Sodium 146, potassium 3.8, chloride 101, CO2 29, BUN 12, creatinine 2.8, glucose 107, T bilirubin 0.7, alkaline phosphatase 279, AST 28, ALT 25.  Calcium 9.9.  CEA 0.8. Results of CT of abdomen and pelvis as in interval history.  IMPRESSION AND PLAN:  Pedro Palmer is a 78 year old man with: 1. Status  post left colectomy 07/03/2010 for a 4.5 cm moderately     differentiated adenocarcinoma of the colon with 0 of 20 positive     lymph nodes.  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  The patient is currently undergoing surveillance.     His current exam and CTs indicate no evidence of recurrence.  He is     up-to-date with colonoscopy.  Pedro Palmer will follow up in 6     months' time with labs only. 2. 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     The patient is currently on Coumadin.  When he follows up in 6     months' time will re-evaluate the need for ongoing anticoagulation.   ______________________________ Pedro Palmer, M.D. LIO/MEDQ  D:  01/13/2012  T:  01/13/2012  Job:  [id number]

## 2012-01-13 NOTE — Progress Notes (Signed)
This office note has been dictated.

## 2012-07-17 ENCOUNTER — Other Ambulatory Visit (HOSPITAL_BASED_OUTPATIENT_CLINIC_OR_DEPARTMENT_OTHER): Payer: Medicare Other | Admitting: Lab

## 2012-07-17 DIAGNOSIS — C186 Malignant neoplasm of descending colon: Secondary | ICD-10-CM

## 2012-07-17 DIAGNOSIS — C189 Malignant neoplasm of colon, unspecified: Secondary | ICD-10-CM

## 2012-07-17 LAB — CBC WITH DIFFERENTIAL/PLATELET
BASO%: 0.4 % (ref 0.0–2.0)
Basophils Absolute: 0 10*3/uL (ref 0.0–0.1)
EOS%: 0.5 % (ref 0.0–7.0)
Eosinophils Absolute: 0 10*3/uL (ref 0.0–0.5)
HCT: 44.5 % (ref 38.4–49.9)
HGB: 14.8 g/dL (ref 13.0–17.1)
LYMPH%: 33.9 % (ref 14.0–49.0)
MCH: 32.9 pg (ref 27.2–33.4)
MCHC: 33.4 g/dL (ref 32.0–36.0)
MCV: 98.7 fL — ABNORMAL HIGH (ref 79.3–98.0)
MONO#: 0.5 10*3/uL (ref 0.1–0.9)
MONO%: 9.4 % (ref 0.0–14.0)
NEUT#: 2.9 10*3/uL (ref 1.5–6.5)
NEUT%: 55.8 % (ref 39.0–75.0)
Platelets: 156 10*3/uL (ref 140–400)
RBC: 4.51 10*6/uL (ref 4.20–5.82)
RDW: 15 % — ABNORMAL HIGH (ref 11.0–14.6)
WBC: 5.1 10*3/uL (ref 4.0–10.3)
lymph#: 1.7 10*3/uL (ref 0.9–3.3)

## 2012-07-17 LAB — COMPREHENSIVE METABOLIC PANEL
ALT: 10 U/L (ref 0–53)
AST: 17 U/L (ref 0–37)
Albumin: 4 g/dL (ref 3.5–5.2)
Alkaline Phosphatase: 284 U/L — ABNORMAL HIGH (ref 39–117)
BUN: 14 mg/dL (ref 6–23)
CO2: 31 mEq/L (ref 19–32)
Calcium: 10.2 mg/dL (ref 8.4–10.5)
Chloride: 107 mEq/L (ref 96–112)
Creatinine, Ser: 1.06 mg/dL (ref 0.50–1.35)
Glucose, Bld: 62 mg/dL — ABNORMAL LOW (ref 70–99)
Potassium: 3.9 mEq/L (ref 3.5–5.3)
Sodium: 144 mEq/L (ref 135–145)
Total Bilirubin: 0.4 mg/dL (ref 0.3–1.2)
Total Protein: 6.5 g/dL (ref 6.0–8.3)

## 2012-07-21 ENCOUNTER — Telehealth: Payer: Self-pay | Admitting: Hematology and Oncology

## 2012-07-21 ENCOUNTER — Ambulatory Visit (HOSPITAL_BASED_OUTPATIENT_CLINIC_OR_DEPARTMENT_OTHER): Payer: Medicare Other | Admitting: Hematology and Oncology

## 2012-07-21 ENCOUNTER — Encounter: Payer: Self-pay | Admitting: Hematology and Oncology

## 2012-07-21 VITALS — BP 168/79 | HR 66 | Temp 97.2°F | Resp 20 | Ht 67.0 in | Wt 178.6 lb

## 2012-07-21 DIAGNOSIS — C185 Malignant neoplasm of splenic flexure: Secondary | ICD-10-CM

## 2012-07-21 DIAGNOSIS — Z7901 Long term (current) use of anticoagulants: Secondary | ICD-10-CM

## 2012-07-21 DIAGNOSIS — Z86711 Personal history of pulmonary embolism: Secondary | ICD-10-CM

## 2012-07-21 DIAGNOSIS — C186 Malignant neoplasm of descending colon: Secondary | ICD-10-CM

## 2012-07-21 DIAGNOSIS — C189 Malignant neoplasm of colon, unspecified: Secondary | ICD-10-CM

## 2012-07-21 DIAGNOSIS — I10 Essential (primary) hypertension: Secondary | ICD-10-CM

## 2012-07-21 NOTE — Progress Notes (Signed)
CC:   Pedro Palmer. Polite, M.D. Anselm Pancoast. Zachery Dakins, M.D. Danise Edge, M.D.  IDENTIFYING STATEMENT:  The patient is a 78 year old man with colon cancer who presents for followup.  INTERVAL HISTORY:  Pedro Palmer was seen 6 months ago.  Since that time, he has had no issues or concerns.  His weight is stable.  He continues on Coumadin.  He has had no changes in his bowel function.  MEDICATIONS:  Reviewed and updated.  ALLERGIES:  None.  PAST MEDICAL HISTORY/FAMILY HISTORY/SOCIAL HISTORY:  Unchanged.  PHYSICAL EXAMINATION:  General:  The patient is a well-appearing, well- nourished man in no distress.  Vitals:  Pulse 66, blood pressure 168/79, temperature 97.2, respirations 20, weight 138.6 pounds.  HEENT:  Head is atraumatic, normocephalic.  Sclerae are anicteric.  Mouth moist.  Chest: Clear.  CVS:  Unremarkable.  Abdomen:  Soft, nontender.  Bowel sounds present.  Extremities:  No edema or calf tenderness.  LABORATORY DATA:  07/17/2012 white cell count 5.1, hemoglobin 14.8, hematocrit 44.5, platelets 156.  Sodium 144, potassium 3.9, chloride 107, CO2 of 31, BUN 14, creatinine 1.06, glucose 62, T-bili 0.4, alkaline phosphatase 284, AST 17, ALT 10, calcium 10.2.  CEA 0.6.  IMPRESSION AND PLAN:  Pedro Palmer is a 78 year old man with: 1. Status post left colectomy on 07/03/2010 for a 4.5-cm moderately-     differentiated adenocarcinoma of the colon with 0 of 20 positive     lymph nodes.  He also has a low-grade GIST tumor that was also     excised.  Chemotherapy was not offered and he is currently     undergoing surveillance.  His current exam and lab work indicate no     evidence of recurrence.  He is also up-to-date with colonoscopy,     which he received on 06/28/2011. 2. History of bilateral pulmonary emboli in the postop setting.  He is     on Coumadin.  I would prefer he stay on Coumadin for another 6     months.  If he has no evidence of recurrence following that CT  scan, then we will consider discontinuing Coumadin.  Six to 8 weeks     later, we will have him return for hypercoagulable workup.    ______________________________ Laurice Record, M.D. LIO/MEDQ  D:  07/21/2012  T:  07/21/2012  Job:  [id number]

## 2012-07-21 NOTE — Telephone Encounter (Signed)
Gave pt appt calendar for February 2014 lab and CT  Then MD visit, gave pt oral contrast, nPO 4 hours prior to CT

## 2012-07-21 NOTE — Patient Instructions (Addendum)
Pedro Palmer  [id number]  [facility] Discharge Instructions  RECOMMENDATIONS MADE BY THE CONSULTANT AND ANY TEST RESULTS WILL BE SENT TO YOUR REFERRING DOCTOR.   EXAM FINDINGS BY MD TODAY AND SIGNS AND SYMPTOMS TO REPORT TO CLINIC OR PRIMARY MD:   Your current list of medications are: Current Outpatient Prescriptions  Medication Sig Dispense Refill  . lisinopril-hydrochlorothiazide (PRINZIDE,ZESTORETIC) 20-12.5 MG per tablet Take 1 tablet by mouth daily.      . Multiple Vitamins-Minerals (CENTRUM SILVER PO) Take 1 tablet by mouth daily.      . simvastatin (ZOCOR) 20 MG tablet Take 20 mg by mouth every evening.      . warfarin (COUMADIN) 5 MG tablet Take 3 mg by mouth daily.          INSTRUCTIONS GIVEN AND DISCUSSED:   SPECIAL INSTRUCTIONS/FOLLOW-UP:  See above.  I acknowledge that I have been informed and understand all the instructions given to me and received a copy. I do not have any more questions at this time, but understand that I may call the [facility] at [phone] during business hours should I have any further questions or need assistance in obtaining follow-up care.

## 2012-07-21 NOTE — Progress Notes (Signed)
This office note has been dictated.

## 2012-11-28 ENCOUNTER — Telehealth: Payer: Self-pay | Admitting: Oncology

## 2012-11-28 NOTE — Telephone Encounter (Signed)
LVOM for pt to return call in re r/s appt.  °

## 2012-12-12 ENCOUNTER — Telehealth: Payer: Self-pay | Admitting: Oncology

## 2012-12-12 ENCOUNTER — Encounter: Payer: Self-pay | Admitting: Oncology

## 2012-12-12 NOTE — Telephone Encounter (Signed)
lvm for pt regarding r/s feb appt to call back monday after 9...Marland Kitchenprinted letter and appt scheduling...former pt of Dr. Marton Redwood assigned to Dr. Clelia Croft

## 2012-12-12 NOTE — Telephone Encounter (Signed)
Talked to patient's daughter and gave her appt with new doctor, letter printed but not sent

## 2012-12-25 ENCOUNTER — Telehealth: Payer: Self-pay | Admitting: Oncology

## 2012-12-25 NOTE — Telephone Encounter (Signed)
Pt called and a former Dr.Odogwu patient , now a patient of Dr. Clelia Croft, gave him appt for lab, Ct then see ML on February 2014

## 2013-01-18 ENCOUNTER — Ambulatory Visit (HOSPITAL_COMMUNITY)
Admission: RE | Admit: 2013-01-18 | Discharge: 2013-01-18 | Disposition: A | Discharge status: Home / Self Care | Payer: Medicare Other | Source: Ambulatory Visit | Attending: Hematology and Oncology | Admitting: Hematology and Oncology

## 2013-01-18 ENCOUNTER — Other Ambulatory Visit (HOSPITAL_BASED_OUTPATIENT_CLINIC_OR_DEPARTMENT_OTHER): Payer: Medicare Other | Admitting: Lab

## 2013-01-18 DIAGNOSIS — Z9049 Acquired absence of other specified parts of digestive tract: Secondary | ICD-10-CM | POA: Insufficient documentation

## 2013-01-18 DIAGNOSIS — M899 Disorder of bone, unspecified: Secondary | ICD-10-CM | POA: Insufficient documentation

## 2013-01-18 DIAGNOSIS — Q254 Congenital malformation of aorta unspecified: Secondary | ICD-10-CM | POA: Insufficient documentation

## 2013-01-18 DIAGNOSIS — C186 Malignant neoplasm of descending colon: Secondary | ICD-10-CM

## 2013-01-18 DIAGNOSIS — I7789 Other specified disorders of arteries and arterioles: Secondary | ICD-10-CM | POA: Insufficient documentation

## 2013-01-18 DIAGNOSIS — K439 Ventral hernia without obstruction or gangrene: Secondary | ICD-10-CM | POA: Insufficient documentation

## 2013-01-18 DIAGNOSIS — R109 Unspecified abdominal pain: Secondary | ICD-10-CM | POA: Insufficient documentation

## 2013-01-18 DIAGNOSIS — I10 Essential (primary) hypertension: Secondary | ICD-10-CM

## 2013-01-18 DIAGNOSIS — C189 Malignant neoplasm of colon, unspecified: Secondary | ICD-10-CM

## 2013-01-18 DIAGNOSIS — K8689 Other specified diseases of pancreas: Secondary | ICD-10-CM | POA: Insufficient documentation

## 2013-01-18 DIAGNOSIS — M949 Disorder of cartilage, unspecified: Secondary | ICD-10-CM | POA: Insufficient documentation

## 2013-01-18 DIAGNOSIS — N281 Cyst of kidney, acquired: Secondary | ICD-10-CM | POA: Insufficient documentation

## 2013-01-18 DIAGNOSIS — E279 Disorder of adrenal gland, unspecified: Secondary | ICD-10-CM | POA: Insufficient documentation

## 2013-01-18 LAB — COMPREHENSIVE METABOLIC PANEL (CC13)
BUN: 11.1 mg/dL (ref 7.0–26.0)
CO2: 31 mEq/L — ABNORMAL HIGH (ref 22–29)
Calcium: 10.7 mg/dL — ABNORMAL HIGH (ref 8.4–10.4)
Chloride: 103 mEq/L (ref 98–107)
Creatinine: 1.2 mg/dL (ref 0.7–1.3)
Glucose: 88 mg/dl (ref 70–99)

## 2013-01-18 LAB — CBC WITH DIFFERENTIAL/PLATELET
Eosinophils Absolute: 0.1 10*3/uL (ref 0.0–0.5)
LYMPH%: 43.3 % (ref 14.0–49.0)
MCV: 96.3 fL (ref 79.3–98.0)
MONO%: 8.3 % (ref 0.0–14.0)
NEUT#: 2.2 10*3/uL (ref 1.5–6.5)
NEUT%: 47.1 % (ref 39.0–75.0)
Platelets: 174 10*3/uL (ref 140–400)
RBC: 4.88 10*6/uL (ref 4.20–5.82)
nRBC: 0 % (ref 0–0)

## 2013-01-18 MED ORDER — IOHEXOL 300 MG/ML  SOLN
100.0000 mL | Freq: Once | INTRAMUSCULAR | Status: AC | PRN
  Administered 2013-01-18: 100 mL via INTRAVENOUS

## 2013-01-19 ENCOUNTER — Encounter: Payer: Self-pay | Admitting: Oncology

## 2013-01-19 NOTE — Progress Notes (Signed)
Hematology and Oncology Follow Up Visit  Pedro Palmer [id number] [birth year] 79 y.o. 01/20/2013 11:45 AM Katy Apo, MDPolite, Deirdre Peer, MD   Principle Diagnosis: Colon cancer diagnosed in July 2011  Secondary Diagnosis:  1. Low grade GIST tumor 2. Bilateral pulmonary emboli in the post-op setting.  Prior Therapy: S/P left colectomy on 07/03/10 for a 4.5 cm moderately differentiated adenocarcinoma of the colon. 0/20 lymph nodes positive.   Current therapy:  Watchful observation for his colon cancer and GIST. He is on Coumadin for his pulmonary emboli.  Interim History:  Pedro Palmer returns for routine follow-up. He reports that he has been doing well. Denies weight loss, decreased appetite and fatigue. Denies chest pain, shortness of breath, and dyspnea. No abdominal pain, nausea, or vomiting. He has not noticed any blood in his stools.   Medications: I have reviewed the patient's current medications. Current outpatient prescriptions:lisinopril-hydrochlorothiazide (PRINZIDE,ZESTORETIC) 20-12.5 MG per tablet, Take 1 tablet by mouth daily., Disp: , Rfl: ;  Multiple Vitamins-Minerals (CENTRUM SILVER PO), Take 1 tablet by mouth daily., Disp: , Rfl: ;  simvastatin (ZOCOR) 20 MG tablet, Take 20 mg by mouth every evening., Disp: , Rfl: ;  warfarin (COUMADIN) 5 MG tablet, Take 4 mg by mouth daily. , Disp: , Rfl:   Allergies: No Known Allergies  Past Medical History, Surgical history, Social history, and Family History were reviewed and updated.  Review of Systems: Constitutional:  Negative for fever, chills, night sweats, anorexia, weight loss, pain. Cardiovascular: no chest pain or dyspnea on exertion Respiratory: no cough, shortness of breath, or wheezing Neurological: no TIA or stroke symptoms Dermatological: negative ENT: negative Skin: Negative. Gastrointestinal: no abdominal pain, change in bowel habits, or black or bloody stools Genito-Urinary: no dysuria, trouble voiding, or  hematuria Hematological and Lymphatic: negative Breast: negative for breast lumps Musculoskeletal: negative Remaining ROS negative.  Physical Exam: Blood pressure 171/85, pulse 76, temperature 94.6 F (34.8 C), temperature source Oral, resp. rate 20, height 5\' 7"  (1.702 m), weight 176 lb 6.4 oz (80.015 kg). ECOG: 1 General appearance: alert, cooperative and no distress Head: Normocephalic, without obvious abnormality, atraumatic Neck: no adenopathy, no carotid bruit, no JVD, supple, symmetrical, trachea midline and thyroid not enlarged, symmetric, no tenderness/mass/nodules Lymph nodes: Cervical, supraclavicular, and axillary nodes normal. Heart:regular rate and rhythm, S1, S2 normal, no murmur, click, rub or gallop Lung:chest clear, no wheezing, rales, normal symmetric air entry. Abdomen: soft, non-tender, without masses or organomegaly EXT:no erythema, induration, or nodules   Lab Results: Lab Results  Component Value Date   WBC 4.7 01/18/2013   HGB 15.8 01/18/2013   HCT 47.0 01/18/2013   MCV 96.3 01/18/2013   PLT 174 01/18/2013     Chemistry      Component Value Date/Time   NA 140 01/18/2013 0912   NA 144 07/17/2012 1411   NA 146* 01/06/2012 0934   K 4.4 01/18/2013 0912   K 3.9 07/17/2012 1411   K 3.8 01/06/2012 0934   CL 103 01/18/2013 0912   CL 107 07/17/2012 1411   CL 101 01/06/2012 0934   CO2 31* 01/18/2013 0912   CO2 31 07/17/2012 1411   CO2 29 01/06/2012 0934   BUN 11.1 01/18/2013 0912   BUN 14 07/17/2012 1411   BUN 12 01/06/2012 0934   CREATININE 1.2 01/18/2013 0912   CREATININE 1.06 07/17/2012 1411   CREATININE 0.8 01/06/2012 0934      Component Value Date/Time   CALCIUM 10.7* 01/18/2013 0912   CALCIUM 10.2 07/17/2012 1411  CALCIUM 9.9 01/06/2012 0934   ALKPHOS 366* 01/18/2013 0912   ALKPHOS 284* 07/17/2012 1411   ALKPHOS 279* 01/06/2012 0934   AST 24 01/18/2013 0912   AST 17 07/17/2012 1411   AST 28 01/06/2012 0934   ALT 27 01/18/2013 0912   ALT 10 07/17/2012 1411   BILITOT 0.46  01/18/2013 0912   BILITOT 0.4 07/17/2012 1411   BILITOT 0.70 01/06/2012 0934       Radiological Studies:  *RADIOLOGY REPORT*  Clinical Data: Colon cancer diagnosed 5/11 with colon resection  and abdominal pain.  CT CHEST, ABDOMEN AND PELVIS WITH CONTRAST  Technique: Contiguous axial images of the chest abdomen and pelvis  were obtained after IV contrast administration.  Contrast: 100 ml Omnipaque-300  Comparison: Plain film chest 12/29/2011. Abdominal pelvic CT  12/29/2011. Chest CT of 08/20/2010. Clinic note of 07/21/2012 also  reviewed.  CT CHEST  Findings: Lung windows demonstrate minimal scarring at the right  lung base.  Left base atelectasis or scarring is also not significantly  changed.  Subpleural dependent opacity at the left medial lung base measures  7 mm on image 34/series 5 and is new since the prior.  Soft tissue windows demonstrate no supraclavicular adenopathy.  Tortuous descending thoracic aorta. Normal heart size without  pericardial or pleural effusion. No mediastinal or hilar  adenopathy.  IMPRESSION:  1. No acute process or evidence of metastatic disease in the chest.  2. 7 mm ill-defined subpleural opacity at the medial left lung  base. Favored to represent an area of volume loss/atelectasis.  Recommend attention on follow-up.  CT ABDOMEN AND PELVIS  Findings: Normal liver. Normal spleen. Gastric underdistention.  Probable pill in the stomach on image 53/series 2. Mild pancreatic  atrophy. Normal gallbladder, biliary tract, right adrenal glands.  Mild left adrenal nodularity which is unchanged. Bilateral renal  cysts. The largest is in the left kidney and measures 6.9 cm. Too  small to characterize left renal lesion.  No retroperitoneal or retrocrural adenopathy.  Normal colon and terminal ileum.  Minimal small bowel extension into a right paracentral ventral  abdominal wall hernia on image 74/series 2. No obstruction. No  ascites. No evidence of  omental or peritoneal disease.  Left internal iliac artery dilatation at 1.1 cm, similar to on the  prior. No pelvic adenopathy. Normal urinary bladder and  prostate. No significant free fluid.  More cephalad tiny fat containing ventral abdominal wall hernia on  image 58/series 2. Heterogeneous sclerosis involving the sacrum  and left greater than right iliac bones is unchanged.  Redemonstration of eighth posterolateral left rib expansion with  heterogeneous lucency within. Similar to 08/20/2010. There is also  increased density involving L1, L2, L4, similar.  IMPRESSION:  1. No acute process or evidence of metastatic disease in the  abdomen or pelvis.  2. Similar mild dilatation of the left internal iliac artery.  3. Similar osseous findings, likely representing Paget's disease.  Original Report Authenticated By: Jeronimo Greaves, M.D.  Impression and Plan: This is a 79 year old gentleman with the following issues: 1. Colon cancer. S/P left hemicolectomy in July 2011. He is currently on observation. Discussed with the patient that based on history, physical exam, labs, and scans, there is no evidence of recurrence. Recommend continued observation. Last colonoscopy was 06/28/11.  2. Low grade GIST tumor. S/P excision. He has no evidence of recurrence on CT scans. Recommend continued observation.  3. History of pulmonary emboli. He has been on Coumadin for almost 3 years.  I have instructed him to stop Coumadin at this time.  4. Follow-up. In 6 months  Case reviewed with Dr Clelia Croft. Spent more than half the time coordinating care.    [place], [place] 2/12/201411:45 AM

## 2013-01-20 ENCOUNTER — Encounter: Payer: Self-pay | Admitting: Oncology

## 2013-01-20 ENCOUNTER — Ambulatory Visit (HOSPITAL_BASED_OUTPATIENT_CLINIC_OR_DEPARTMENT_OTHER): Payer: Medicare Other | Admitting: Oncology

## 2013-01-20 ENCOUNTER — Telehealth: Payer: Self-pay | Admitting: Oncology

## 2013-01-20 VITALS — BP 171/85 | HR 76 | Temp 94.6°F | Resp 20 | Ht 67.0 in | Wt 176.4 lb

## 2013-01-20 DIAGNOSIS — C186 Malignant neoplasm of descending colon: Secondary | ICD-10-CM

## 2013-01-20 DIAGNOSIS — M889 Osteitis deformans of unspecified bone: Secondary | ICD-10-CM | POA: Insufficient documentation

## 2013-01-20 DIAGNOSIS — Z86711 Personal history of pulmonary embolism: Secondary | ICD-10-CM

## 2013-01-20 DIAGNOSIS — C189 Malignant neoplasm of colon, unspecified: Secondary | ICD-10-CM

## 2013-07-21 ENCOUNTER — Telehealth: Payer: Self-pay | Admitting: Oncology

## 2013-07-21 ENCOUNTER — Ambulatory Visit (HOSPITAL_BASED_OUTPATIENT_CLINIC_OR_DEPARTMENT_OTHER): Payer: Medicare Other | Admitting: Oncology

## 2013-07-21 ENCOUNTER — Other Ambulatory Visit (HOSPITAL_BASED_OUTPATIENT_CLINIC_OR_DEPARTMENT_OTHER): Payer: Medicare Other

## 2013-07-21 VITALS — BP 146/87 | HR 69 | Temp 96.8°F | Resp 18 | Ht 67.0 in | Wt 169.2 lb

## 2013-07-21 DIAGNOSIS — C186 Malignant neoplasm of descending colon: Secondary | ICD-10-CM

## 2013-07-21 DIAGNOSIS — C189 Malignant neoplasm of colon, unspecified: Secondary | ICD-10-CM

## 2013-07-21 DIAGNOSIS — Z86711 Personal history of pulmonary embolism: Secondary | ICD-10-CM

## 2013-07-21 LAB — CBC WITH DIFFERENTIAL/PLATELET
Basophils Absolute: 0 10*3/uL (ref 0.0–0.1)
HCT: 47.3 % (ref 38.4–49.9)
HGB: 16 g/dL (ref 13.0–17.1)
MONO#: 0.5 10*3/uL (ref 0.1–0.9)
NEUT#: 3.4 10*3/uL (ref 1.5–6.5)
NEUT%: 59.3 % (ref 39.0–75.0)
RDW: 14.3 % (ref 11.0–14.6)
WBC: 5.8 10*3/uL (ref 4.0–10.3)
lymph#: 1.8 10*3/uL (ref 0.9–3.3)

## 2013-07-21 LAB — COMPREHENSIVE METABOLIC PANEL (CC13)
ALT: 11 U/L (ref 0–55)
Albumin: 4 g/dL (ref 3.5–5.0)
BUN: 17.8 mg/dL (ref 7.0–26.0)
CO2: 28 mEq/L (ref 22–29)
Calcium: 10.6 mg/dL — ABNORMAL HIGH (ref 8.4–10.4)
Chloride: 105 mEq/L (ref 98–109)
Creatinine: 1.1 mg/dL (ref 0.7–1.3)
Potassium: 4.6 mEq/L (ref 3.5–5.1)

## 2013-07-21 LAB — CEA: CEA: 0.6 ng/mL (ref 0.0–5.0)

## 2013-07-21 NOTE — Progress Notes (Signed)
Hematology and Oncology Follow Up Visit  Pedro Palmer [id number] 23-Mar-1933 79 y.o. 07/21/2013 9:24 AM Pedro Palmer, MDPolite, Pedro Peer, MD   Principle Diagnosis: 67 year old with colon cancer diagnosed in July 2011  Secondary Diagnosis:  1. Low grade GIST tumor 2. Bilateral pulmonary emboli in the post-op setting.  Prior Therapy: S/P left colectomy on 07/03/10 for a 4.5 cm moderately differentiated adenocarcinoma of the colon. 0/20 lymph nodes positive.   Current therapy:  Watchful observation for his colon cancer and GIST. He is off Coumadin for his pulmonary emboli since 01/2013.  Interim History:  Mr Pedro Palmer returns for routine follow-up. He reports that he has been doing well. Denies weight loss, decreased appetite and fatigue. Denies chest pain, shortness of breath, and dyspnea. No abdominal pain, nausea, or vomiting. He has not noticed any blood in his stools. He reports no bleeding or clotting episodes. No hospitalizations or illnesses.   Medications: I have reviewed the patient's current medications. Current Outpatient Prescriptions  Medication Sig Dispense Refill  . lisinopril-hydrochlorothiazide (PRINZIDE,ZESTORETIC) 20-12.5 MG per tablet Take 1 tablet by mouth daily.      . Multiple Vitamins-Minerals (CENTRUM SILVER PO) Take 1 tablet by mouth daily.      . simvastatin (ZOCOR) 20 MG tablet Take 20 mg by mouth every evening.       No current facility-administered medications for this visit.    Allergies: No Known Allergies  Past Medical History, Surgical history, Social history, and Family History were reviewed and updated.  Review of Systems: Constitutional:  Negative for fever, chills, night sweats, anorexia, weight loss, pain. Cardiovascular: no chest pain or dyspnea on exertion Respiratory: no cough, shortness of breath, or wheezing Neurological: no TIA or stroke symptoms Dermatological: negative ENT: negative Skin: Negative. Gastrointestinal: no abdominal pain,  change in bowel habits, or black or bloody stools Genito-Urinary: no dysuria, trouble voiding, or hematuria Hematological and Lymphatic: negative Breast: negative for breast lumps Musculoskeletal: negative Remaining ROS negative.  Physical Exam: Blood pressure 146/87, pulse 69, temperature 96.8 F (36 C), temperature source Oral, resp. rate 18, height 5\' 7"  (1.702 m), weight 169 lb 3.2 oz (76.749 kg). ECOG: 1 General appearance: alert, cooperative and no distress Head: Normocephalic, without obvious abnormality, atraumatic Neck: no adenopathy, no carotid bruit, no JVD, supple, symmetrical, trachea midline and thyroid not enlarged, symmetric, no tenderness/mass/nodules Lymph nodes: Cervical, supraclavicular, and axillary nodes normal. Heart:regular rate and rhythm, S1, S2 normal, no murmur, click, rub or gallop Lung:chest clear, no wheezing, rales, normal symmetric air entry. Abdomen: soft, non-tender, without masses or organomegaly EXT:no erythema, induration, or nodules   Lab Results: Lab Results  Component Value Date   WBC 5.8 07/21/2013   HGB 16.0 07/21/2013   HCT 47.3 07/21/2013   MCV 97.2 07/21/2013   PLT 164 07/21/2013     Chemistry      Component Value Date/Time   NA 140 01/18/2013 0912   NA 144 07/17/2012 1411   NA 146* 01/06/2012 0934   K 4.4 01/18/2013 0912   K 3.9 07/17/2012 1411   K 3.8 01/06/2012 0934   CL 103 01/18/2013 0912   CL 107 07/17/2012 1411   CL 101 01/06/2012 0934   CO2 31* 01/18/2013 0912   CO2 31 07/17/2012 1411   CO2 29 01/06/2012 0934   BUN 11.1 01/18/2013 0912   BUN 14 07/17/2012 1411   BUN 12 01/06/2012 0934   CREATININE 1.2 01/18/2013 0912   CREATININE 1.06 07/17/2012 1411   CREATININE 0.8 01/06/2012  4540      Component Value Date/Time   CALCIUM 10.7* 01/18/2013 0912   CALCIUM 10.2 07/17/2012 1411   CALCIUM 9.9 01/06/2012 0934   ALKPHOS 366* 01/18/2013 0912   ALKPHOS 284* 07/17/2012 1411   ALKPHOS 279* 01/06/2012 0934   AST 24 01/18/2013 0912   AST 17 07/17/2012  1411   AST 28 01/06/2012 0934   ALT 27 01/18/2013 0912   ALT 10 07/17/2012 1411   ALT 25 01/06/2012 0934   BILITOT 0.46 01/18/2013 0912   BILITOT 0.4 07/17/2012 1411   BILITOT 0.70 01/06/2012 0934       Impression and Plan: This is a 79 year old gentleman with the following issues: 1. Colon cancer. S/P left hemicolectomy in July 2011. He is currently on observation. Discussed with the patient that based on history, physical exam, labs, and scans, there is no evidence of recurrence. Recommend continued observation. Last colonoscopy was 06/28/11. We will continue active follow up and repeat CT scan in 01/2014 and follow up then.   2. Low grade GIST tumor. S/P excision. He has no evidence of recurrence on CT scans. Recommend continued observation.  3. History of pulmonary emboli. He was on Coumadin for almost 3 years. Off of it in the last months without complications.   4. Follow-up. In 6 months     [facility] 8/13/20149:24 AM

## 2013-07-21 NOTE — Telephone Encounter (Signed)
Gave pt appt appt for lab and MD visit for February 2015, gave pt oral contrast

## 2013-07-22 ENCOUNTER — Telehealth: Payer: Self-pay | Admitting: *Deleted

## 2013-07-22 NOTE — Telephone Encounter (Signed)
Mailed copy of patient's labs to his home, per dr Alver Fisher request.

## 2013-07-22 NOTE — Telephone Encounter (Signed)
No note

## 2013-08-11 ENCOUNTER — Other Ambulatory Visit: Payer: Medicare Other | Admitting: Lab

## 2014-01-11 ENCOUNTER — Ambulatory Visit (HOSPITAL_COMMUNITY): Admission: RE | Admit: 2014-01-11 | Payer: Medicare Other | Source: Ambulatory Visit

## 2014-01-11 ENCOUNTER — Other Ambulatory Visit: Payer: Medicare Other

## 2014-01-12 ENCOUNTER — Other Ambulatory Visit (HOSPITAL_BASED_OUTPATIENT_CLINIC_OR_DEPARTMENT_OTHER): Payer: Medicare Other

## 2014-01-12 ENCOUNTER — Encounter (HOSPITAL_COMMUNITY): Payer: Self-pay

## 2014-01-12 ENCOUNTER — Ambulatory Visit (HOSPITAL_COMMUNITY)
Admission: RE | Admit: 2014-01-12 | Discharge: 2014-01-12 | Disposition: A | Discharge status: Home / Self Care | Payer: Medicare Other | Source: Ambulatory Visit | Attending: Oncology | Admitting: Oncology

## 2014-01-12 DIAGNOSIS — C189 Malignant neoplasm of colon, unspecified: Secondary | ICD-10-CM

## 2014-01-12 DIAGNOSIS — M948X9 Other specified disorders of cartilage, unspecified sites: Secondary | ICD-10-CM | POA: Insufficient documentation

## 2014-01-12 DIAGNOSIS — C186 Malignant neoplasm of descending colon: Secondary | ICD-10-CM

## 2014-01-12 DIAGNOSIS — N281 Cyst of kidney, acquired: Secondary | ICD-10-CM | POA: Insufficient documentation

## 2014-01-12 DIAGNOSIS — M47814 Spondylosis without myelopathy or radiculopathy, thoracic region: Secondary | ICD-10-CM | POA: Insufficient documentation

## 2014-01-12 DIAGNOSIS — J984 Other disorders of lung: Secondary | ICD-10-CM | POA: Insufficient documentation

## 2014-01-12 DIAGNOSIS — N400 Enlarged prostate without lower urinary tract symptoms: Secondary | ICD-10-CM | POA: Insufficient documentation

## 2014-01-12 DIAGNOSIS — R911 Solitary pulmonary nodule: Secondary | ICD-10-CM | POA: Insufficient documentation

## 2014-01-12 DIAGNOSIS — I700 Atherosclerosis of aorta: Secondary | ICD-10-CM | POA: Insufficient documentation

## 2014-01-12 DIAGNOSIS — M47817 Spondylosis without myelopathy or radiculopathy, lumbosacral region: Secondary | ICD-10-CM | POA: Insufficient documentation

## 2014-01-12 LAB — CBC WITH DIFFERENTIAL/PLATELET
BASO%: 0.6 % (ref 0.0–2.0)
Basophils Absolute: 0 10*3/uL (ref 0.0–0.1)
EOS ABS: 0 10*3/uL (ref 0.0–0.5)
EOS%: 0.6 % (ref 0.0–7.0)
HEMATOCRIT: 48.3 % (ref 38.4–49.9)
HGB: 16 g/dL (ref 13.0–17.1)
LYMPH%: 41.1 % (ref 14.0–49.0)
MCH: 32.7 pg (ref 27.2–33.4)
MCHC: 33.2 g/dL (ref 32.0–36.0)
MCV: 98.3 fL — ABNORMAL HIGH (ref 79.3–98.0)
MONO#: 0.5 10*3/uL (ref 0.1–0.9)
MONO%: 10 % (ref 0.0–14.0)
NEUT#: 2.6 10*3/uL (ref 1.5–6.5)
NEUT%: 47.7 % (ref 39.0–75.0)
PLATELETS: 178 10*3/uL (ref 140–400)
RBC: 4.91 10*6/uL (ref 4.20–5.82)
RDW: 14.3 % (ref 11.0–14.6)
WBC: 5.4 10*3/uL (ref 4.0–10.3)
lymph#: 2.2 10*3/uL (ref 0.9–3.3)

## 2014-01-12 LAB — COMPREHENSIVE METABOLIC PANEL (CC13)
ALT: 19 U/L (ref 0–55)
ANION GAP: 8 meq/L (ref 3–11)
AST: 19 U/L (ref 5–34)
Albumin: 4.4 g/dL (ref 3.5–5.0)
Alkaline Phosphatase: 332 U/L — ABNORMAL HIGH (ref 40–150)
BILIRUBIN TOTAL: 0.6 mg/dL (ref 0.20–1.20)
BUN: 11.3 mg/dL (ref 7.0–26.0)
CO2: 29 meq/L (ref 22–29)
CREATININE: 1 mg/dL (ref 0.7–1.3)
Calcium: 10.8 mg/dL — ABNORMAL HIGH (ref 8.4–10.4)
Chloride: 104 mEq/L (ref 98–109)
GLUCOSE: 95 mg/dL (ref 70–140)
Potassium: 4.4 mEq/L (ref 3.5–5.1)
Sodium: 141 mEq/L (ref 136–145)
Total Protein: 7.2 g/dL (ref 6.4–8.3)

## 2014-01-12 MED ORDER — IOHEXOL 300 MG/ML  SOLN
100.0000 mL | Freq: Once | INTRAMUSCULAR | Status: AC | PRN
  Administered 2014-01-12: 100 mL via INTRAVENOUS

## 2014-01-13 ENCOUNTER — Encounter: Payer: Self-pay | Admitting: Oncology

## 2014-01-13 ENCOUNTER — Telehealth: Payer: Self-pay | Admitting: Oncology

## 2014-01-13 ENCOUNTER — Ambulatory Visit (HOSPITAL_BASED_OUTPATIENT_CLINIC_OR_DEPARTMENT_OTHER): Payer: Medicare Other | Admitting: Oncology

## 2014-01-13 VITALS — BP 149/77 | HR 81 | Temp 96.7°F | Resp 18 | Ht 67.0 in | Wt 169.1 lb

## 2014-01-13 DIAGNOSIS — C189 Malignant neoplasm of colon, unspecified: Secondary | ICD-10-CM

## 2014-01-13 DIAGNOSIS — C186 Malignant neoplasm of descending colon: Secondary | ICD-10-CM

## 2014-01-13 DIAGNOSIS — Z86711 Personal history of pulmonary embolism: Secondary | ICD-10-CM

## 2014-01-13 NOTE — Telephone Encounter (Signed)
gv and printed appt scheda nd avs for pt for Feb 2016....gv pt barium °

## 2014-01-13 NOTE — Progress Notes (Signed)
Hematology and Oncology Follow Up Visit  Pedro Palmer [id number] [birth year] 80 y.o. 01/13/2014 9:40 AM Pedro Palmer, MDPolite, Pedro Dawes, MD   Principle Diagnosis: 80 year old with colon cancer diagnosed in July 2011  Secondary Diagnosis:  1. Low grade GIST.  2. Bilateral pulmonary emboli in the post-op setting.  Prior Therapy: S/P left colectomy on 07/03/10 for a 4.5 cm moderately differentiated adenocarcinoma of the colon. 0/20 lymph nodes positive.   Current therapy:  Watchful observation for his colon cancer and GIST. He is off Coumadin for his pulmonary emboli since 01/2013.  Interim History:  Pedro Palmer returns for routine follow-up. He reports that he has been doing well. Denies weight loss, decreased appetite and fatigue. Denies chest pain, shortness of breath, and dyspnea. No abdominal pain, nausea, or vomiting. He has not noticed any blood in his stools. He reports no bleeding or clotting episodes. No hospitalizations or illnesses. He continues to be very functional and performs activities of daily living without any hindrance or decline. Has not reported any major changes in his bowels or activity level.  Medications: I have reviewed the patient's current medications. Current Outpatient Prescriptions  Medication Sig Dispense Refill  . lisinopril-hydrochlorothiazide (PRINZIDE,ZESTORETIC) 20-12.5 MG per tablet Take 1 tablet by mouth daily.      . Multiple Vitamins-Minerals (CENTRUM SILVER PO) Take 1 tablet by mouth daily.      . simvastatin (ZOCOR) 20 MG tablet Take 20 mg by mouth every evening.       No current facility-administered medications for this visit.    Allergies: No Known Allergies  Past Medical History, Surgical history, Social history, and Family History were reviewed and updated.  Review of Systems: Constitutional:  Negative for fever, chills, night sweats, anorexia, weight loss, pain.  Remaining ROS negative.  Physical Exam: Blood pressure 149/77, pulse  81, temperature 96.7 F (35.9 C), temperature source Oral, resp. rate 18, height 5\' 7"  (1.702 m), weight 169 lb 1.6 oz (76.703 kg). ECOG: 1 General appearance: alert, cooperative and no distress Head: Normocephalic, without obvious abnormality, atraumatic Neck: no adenopathy, no carotid bruit, no JVD, supple, symmetrical, trachea midline and thyroid not enlarged, symmetric, no tenderness/mass/nodules Lymph nodes: Cervical, supraclavicular, and axillary nodes normal. Heart:regular rate and rhythm, S1, S2 normal, no murmur, click, rub or gallop Lung:chest clear, no wheezing, rales, normal symmetric air entry. Abdomen: soft, non-tender, without masses or organomegaly EXT:no erythema, induration, or nodules   Lab Results: Lab Results  Component Value Date   WBC 5.4 01/12/2014   HGB 16.0 01/12/2014   HCT 48.3 01/12/2014   MCV 98.3* 01/12/2014   PLT 178 01/12/2014     Chemistry      Component Value Date/Time   NA 141 01/12/2014 1300   NA 144 07/17/2012 1411   NA 146* 01/06/2012 0934   K 4.4 01/12/2014 1300   K 3.9 07/17/2012 1411   K 3.8 01/06/2012 0934   CL 103 01/18/2013 0912   CL 107 07/17/2012 1411   CL 101 01/06/2012 0934   CO2 29 01/12/2014 1300   CO2 31 07/17/2012 1411   CO2 29 01/06/2012 0934   BUN 11.3 01/12/2014 1300   BUN 14 07/17/2012 1411   BUN 12 01/06/2012 0934   CREATININE 1.0 01/12/2014 1300   CREATININE 1.06 07/17/2012 1411   CREATININE 0.8 01/06/2012 0934      Component Value Date/Time   CALCIUM 10.8* 01/12/2014 1300   CALCIUM 10.2 07/17/2012 1411   CALCIUM 9.9 01/06/2012 0934   ALKPHOS  332* 01/12/2014 1300   ALKPHOS 284* 07/17/2012 1411   ALKPHOS 279* 01/06/2012 0934   AST 19 01/12/2014 1300   AST 17 07/17/2012 1411   AST 28 01/06/2012 0934   ALT 19 01/12/2014 1300   ALT 10 07/17/2012 1411   ALT 25 01/06/2012 0934   BILITOT 0.60 01/12/2014 1300   BILITOT 0.4 07/17/2012 1411   BILITOT 0.70 01/06/2012 0934      EXAM:  CT CHEST, ABDOMEN, AND PELVIS WITH CONTRAST  TECHNIQUE:  Multidetector CT imaging of  the chest, abdomen and pelvis was  performed following the standard protocol during bolus  administration of intravenous contrast.  CONTRAST: 122mL OMNIPAQUE IOHEXOL 300 MG/ML SOLN  COMPARISON: 01/18/2013  FINDINGS:  CT CHEST FINDINGS  No pleural effusion identified. No airspace consolidation or  atelectasis noted. There is scarring identified within the left  base. Tiny nodule within the left upper lobe is unchanged measuring  2 mm, image 18/series 4. No suspicious/enlarging pulmonary nodules  are mass is noted.  The trachea appears patent and is midline. The heart size appears  normal. Calcified atherosclerotic disease involves the transverse  aortic arch. No enlarged mediastinal or hilar lymph nodes  identified. There is no axillary or supraclavicular adenopathy  identified.  CT ABDOMEN AND PELVIS FINDINGS  No focal liver abnormality identified. The gallbladder is normal. No  biliary dilatation. Normal appearance of the pancreas. The spleen  appears normal.  Normal appearance of both adrenal glands. Both kidneys contain  multiple simple appearing cysts. The urinary bladder appears normal.  There is prostate gland enlargement.  Calcified atherosclerotic disease affects the abdominal aorta. There  is no aneurysm. No upper abdominal adenopathy identified. No pelvic  or inguinal adenopathy noted. There is no ascites within the abdomen  or pelvis. No peritoneal nodule or mass identified.  The stomach appears normal. The small bowel loops have a normal  caliber without evidence for obstruction. Normal appearance of the  colon.  Review of the visualized osseous structures shows multi level  spondylosis within the thoracic and lumbar spine. Pagetoid changes  involving the sacrum are again identified increased density of the  L1, L2 and L4 vertebra appear similar to previous exam. There is  also increased sclerosis involving the iliac bones, left greater  than right. Eighth posterior  lateral left rib expansion with  heterogeneous internal lucency is similar to previous study.  IMPRESSION:  1. No acute process or evidence of metastatic disease within the  chest, abdomen or pelvis.  2. Similar osseous findings, likely related to Paget's disease.  3. Prostate gland enlargement.  Impression and Plan: This is a 80 year old gentleman with the following issues: 1. Colon cancer. S/P left hemicolectomy in July 2011. He is currently on observation. The results of his laboratory data and CT scan were discussed today and did not show any evidence of recurrent disease. The plan at this time, is to continue active surveillance with a yearly followup and imaging study to his beyond 5 years.  2. Low grade GIST. S/P excision. He has no evidence of recurrence on CT scans. Recommend continued observation.  3. History of pulmonary emboli. He was on Coumadin for almost 3 years. Off of it in the last months without complications.   4. Follow-up. In 12 months     PNTIRW,ERXVQ 2/5/20159:40 AM

## 2014-06-15 ENCOUNTER — Other Ambulatory Visit: Payer: Self-pay | Admitting: Gastroenterology

## 2014-07-05 ENCOUNTER — Encounter (HOSPITAL_COMMUNITY): Payer: Self-pay | Admitting: Pharmacy Technician

## 2014-07-18 ENCOUNTER — Encounter (HOSPITAL_COMMUNITY): Payer: Self-pay | Admitting: *Deleted

## 2014-07-25 ENCOUNTER — Ambulatory Visit (HOSPITAL_COMMUNITY)
Admission: RE | Admit: 2014-07-25 | Discharge: 2014-07-25 | Disposition: A | Discharge status: Home / Self Care | Payer: Medicare Other | Source: Ambulatory Visit | Attending: Gastroenterology | Admitting: Gastroenterology

## 2014-07-25 ENCOUNTER — Encounter (HOSPITAL_COMMUNITY)
Admission: RE | Disposition: A | Discharge status: Home / Self Care | Payer: Self-pay | Source: Ambulatory Visit | Attending: Gastroenterology

## 2014-07-25 ENCOUNTER — Ambulatory Visit (HOSPITAL_COMMUNITY): Payer: Medicare Other | Admitting: Certified Registered Nurse Anesthetist

## 2014-07-25 ENCOUNTER — Encounter (HOSPITAL_COMMUNITY): Payer: Medicare Other | Admitting: Certified Registered Nurse Anesthetist

## 2014-07-25 ENCOUNTER — Encounter (HOSPITAL_COMMUNITY): Payer: Self-pay | Admitting: *Deleted

## 2014-07-25 DIAGNOSIS — Z1211 Encounter for screening for malignant neoplasm of colon: Secondary | ICD-10-CM | POA: Diagnosis present

## 2014-07-25 DIAGNOSIS — I10 Essential (primary) hypertension: Secondary | ICD-10-CM | POA: Insufficient documentation

## 2014-07-25 DIAGNOSIS — Z9049 Acquired absence of other specified parts of digestive tract: Secondary | ICD-10-CM | POA: Diagnosis not present

## 2014-07-25 DIAGNOSIS — Z9089 Acquired absence of other organs: Secondary | ICD-10-CM | POA: Insufficient documentation

## 2014-07-25 DIAGNOSIS — E7800 Pure hypercholesterolemia, unspecified: Secondary | ICD-10-CM | POA: Diagnosis not present

## 2014-07-25 DIAGNOSIS — Z86711 Personal history of pulmonary embolism: Secondary | ICD-10-CM | POA: Diagnosis not present

## 2014-07-25 DIAGNOSIS — Z85038 Personal history of other malignant neoplasm of large intestine: Secondary | ICD-10-CM | POA: Insufficient documentation

## 2014-07-25 HISTORY — PX: COLONOSCOPY WITH PROPOFOL: SHX5780

## 2014-07-25 HISTORY — DX: Unspecified osteoarthritis, unspecified site: M19.90

## 2014-07-25 SURGERY — COLONOSCOPY WITH PROPOFOL
Anesthesia: Monitor Anesthesia Care

## 2014-07-25 MED ORDER — LIDOCAINE HCL (CARDIAC) 20 MG/ML IV SOLN
INTRAVENOUS | Status: AC
  Filled 2014-07-25: qty 5

## 2014-07-25 MED ORDER — SODIUM CHLORIDE 0.9 % IV SOLN: INTRAVENOUS | Status: DC

## 2014-07-25 MED ORDER — PROPOFOL INFUSION 10 MG/ML OPTIME
INTRAVENOUS | Status: DC | PRN
  Administered 2014-07-25: 140 ug/kg/min via INTRAVENOUS

## 2014-07-25 MED ORDER — PROPOFOL 10 MG/ML IV BOLUS
INTRAVENOUS | Status: AC
  Filled 2014-07-25: qty 20

## 2014-07-25 MED ORDER — PROPOFOL 10 MG/ML IV BOLUS
INTRAVENOUS | Status: DC | PRN
  Administered 2014-07-25: 20 mg via INTRAVENOUS

## 2014-07-25 MED ORDER — ONDANSETRON HCL 4 MG/2ML IJ SOLN
INTRAMUSCULAR | Status: AC
  Filled 2014-07-25: qty 2

## 2014-07-25 MED ORDER — ONDANSETRON HCL 4 MG/2ML IJ SOLN
INTRAMUSCULAR | Status: DC | PRN
  Administered 2014-07-25: 4 mg via INTRAVENOUS

## 2014-07-25 MED ORDER — LACTATED RINGERS IV SOLN
INTRAVENOUS | Status: DC
  Administered 2014-07-25: 10:00:00 via INTRAVENOUS

## 2014-07-25 MED ORDER — KETAMINE HCL 10 MG/ML IJ SOLN
INTRAMUSCULAR | Status: DC | PRN
  Administered 2014-07-25 (×2): 10 mg via INTRAVENOUS

## 2014-07-25 SURGICAL SUPPLY — 22 items

## 2014-07-25 NOTE — Anesthesia Postprocedure Evaluation (Signed)
  Anesthesia Post-op Note  Patient: Pedro Palmer  Procedure(s) Performed: Procedure(s) (LRB): COLONOSCOPY WITH PROPOFOL (N/A)  Patient Location: PACU  Anesthesia Type: MAC  Level of Consciousness: awake and alert   Airway and Oxygen Therapy: Patient Spontanous Breathing  Post-op Pain: mild  Post-op Assessment: Post-op Vital signs reviewed, Patient's Cardiovascular Status Stable, Respiratory Function Stable, Patent Airway and No signs of Nausea or vomiting  Last Vitals:  Filed Vitals:   07/25/14 1200  BP: 156/75  Pulse: 47  Temp:   Resp: 14    Post-op Vital Signs: stable   Complications: No apparent anesthesia complications

## 2014-07-25 NOTE — Transfer of Care (Signed)
Immediate Anesthesia Transfer of Care Note  Patient: Pedro Palmer  Procedure(s) Performed: Procedure(s) (LRB): COLONOSCOPY WITH PROPOFOL (N/A)  Patient Location: PACU  Anesthesia Type: MAC  Level of Consciousness: sedated, patient cooperative and responds to stimulation  Airway & Oxygen Therapy: Patient Spontanous Breathing and Patient connected to face mask oxgen  Post-op Assessment: Report given to PACU RN and Post -op Vital signs reviewed and stable  Post vital signs: Reviewed and stable  Complications: No apparent anesthesia complications

## 2014-07-25 NOTE — Discharge Instructions (Signed)

## 2014-07-25 NOTE — H&P (Signed)
  Procedure: Surveillance colonoscopy. Sigmoid colon cancer surgery performed in 2011. Normal surveillance colonoscopy performed on 06/10/2011  History: The patient is a 80 year old male born 1933/09/06. He underwent a sigmoid colon cancer surgery in 2011 and underwent a normal surveillance colonoscopy on 06/10/2011.  The patient is scheduled to undergo repeat surveillance colonoscopy.  Past medical history: Hypertension. Hypercholesterolemia. Sigmoid colon cancer. Pulmonary embolism post colon cancer surgery. Paget's disease. Appendectomy. Segmental sigmoid colon resection.  Medication allergies: None  Exam: The patient is alert and lying comfortably on the endoscopy stretcher. Abdomen is soft and nontender to palpation. Lungs are clear to auscultation. Cardiac exam reveals a regular rhythm.  Plan: Proceed with surveillance colonoscopy

## 2014-07-25 NOTE — Anesthesia Preprocedure Evaluation (Signed)
Anesthesia Evaluation  Patient identified by MRN, date of birth, ID band Patient awake    Reviewed: Allergy & Precautions, H&P , NPO status , Patient's Chart, lab work & pertinent test results  Airway Mallampati: II TM Distance: >3 FB Neck ROM: Full    Dental no notable dental hx.    Pulmonary PE Pulmonary emboli 2011   breath sounds clear to auscultation  Pulmonary exam normal       Cardiovascular hypertension, Pt. on medications Rhythm:Regular Rate:Normal     Neuro/Psych negative neurological ROS  negative psych ROS   GI/Hepatic negative GI ROS, Neg liver ROS,   Endo/Other  negative endocrine ROS  Renal/GU negative Renal ROS  negative genitourinary   Musculoskeletal negative musculoskeletal ROS (+)   Abdominal   Peds negative pediatric ROS (+)  Hematology negative hematology ROS (+)   Anesthesia Other Findings   Reproductive/Obstetrics negative OB ROS                           Anesthesia Physical Anesthesia Plan  ASA: II  Anesthesia Plan: MAC   Post-op Pain Management:    Induction: Intravenous  Airway Management Planned: Nasal Cannula  Additional Equipment:   Intra-op Plan:   Post-operative Plan:   Informed Consent: I have reviewed the patients History and Physical, chart, labs and discussed the procedure including the risks, benefits and alternatives for the proposed anesthesia with the patient or authorized representative who has indicated his/her understanding and acceptance.   Dental advisory given  Plan Discussed with: CRNA and Surgeon  Anesthesia Plan Comments:         Anesthesia Quick Evaluation

## 2014-07-25 NOTE — Op Note (Signed)
Procedure: Surveillance colonoscopy. Sigmoid colon cancer surgery performed in 2011. Normal surveillance colonoscopy performed on 06/10/2011  Endoscopist: Earle Gell  Premedication: Propofol administered by anesthesia  Procedure: The patient was placed in the left lateral decubitus position. Anal inspection and digital rectal exam were normal. The Pentax pediatric colonoscope was introduced into the rectum and advanced to the cecum. A normal-appearing appendiceal orifice and ileocecal valve were identified. Colonic preparation for the exam today was good  Rectum. Normal. Retroflexed view of the distal rectum normal  Sigmoid colon and descending colon. Normal post sigmoid colon resection for colon cancer  Splenic flexure. Normal  Transverse colon. Normal  Hepatic flexure. Normal  Ascending colon. Normal  Cecum and ileocecal valve. Normal.  Assessment: Normal surveillance colonoscopy post sigmoid colon resection for adenocarcinoma of the colon in 2011  Recommendation: I do not recommend performing surveillance colonoscopies in the future

## 2014-07-26 ENCOUNTER — Encounter (HOSPITAL_COMMUNITY): Payer: Self-pay | Admitting: Gastroenterology

## 2014-10-25 ENCOUNTER — Telehealth: Payer: Self-pay | Admitting: Oncology

## 2014-10-25 NOTE — Telephone Encounter (Signed)
Lvm advising appt chg from 2/12 ([facility]) to 2/11 @ .30am. Also mailed revised appt calendar.

## 2015-01-13 ENCOUNTER — Telehealth: Payer: Self-pay | Admitting: *Deleted

## 2015-01-13 ENCOUNTER — Ambulatory Visit (HOSPITAL_COMMUNITY): Payer: Medicare Other

## 2015-01-13 ENCOUNTER — Other Ambulatory Visit: Payer: Medicare Other

## 2015-01-13 NOTE — Telephone Encounter (Signed)
Patient no-show for labs and CT today, called lmoam for patient to call me.

## 2015-01-17 ENCOUNTER — Encounter: Payer: Self-pay | Admitting: *Deleted

## 2015-01-17 DIAGNOSIS — C189 Malignant neoplasm of colon, unspecified: Secondary | ICD-10-CM

## 2015-01-17 NOTE — Progress Notes (Signed)
Spoke with patient, states he forgot his lab/Ct appt on 01/13/15, would like to re-schedule. Order put in for ct/chest/abd and pelvis /w contrast.

## 2015-01-18 ENCOUNTER — Ambulatory Visit (HOSPITAL_COMMUNITY)
Admission: RE | Admit: 2015-01-18 | Discharge: 2015-01-18 | Disposition: A | Discharge status: Home / Self Care | Payer: Medicare Other | Source: Ambulatory Visit | Attending: Oncology | Admitting: Oncology

## 2015-01-18 ENCOUNTER — Other Ambulatory Visit: Payer: Self-pay | Admitting: *Deleted

## 2015-01-18 ENCOUNTER — Ambulatory Visit (HOSPITAL_BASED_OUTPATIENT_CLINIC_OR_DEPARTMENT_OTHER): Payer: Medicare Other

## 2015-01-18 ENCOUNTER — Encounter (HOSPITAL_COMMUNITY): Payer: Self-pay

## 2015-01-18 DIAGNOSIS — Z85038 Personal history of other malignant neoplasm of large intestine: Secondary | ICD-10-CM

## 2015-01-18 DIAGNOSIS — Z9889 Other specified postprocedural states: Secondary | ICD-10-CM | POA: Insufficient documentation

## 2015-01-18 DIAGNOSIS — C189 Malignant neoplasm of colon, unspecified: Secondary | ICD-10-CM

## 2015-01-18 LAB — BASIC METABOLIC PANEL (CC13)
ANION GAP: 10 meq/L (ref 3–11)
BUN: 12.7 mg/dL (ref 7.0–26.0)
CO2: 30 mEq/L — ABNORMAL HIGH (ref 22–29)
Calcium: 10.3 mg/dL (ref 8.4–10.4)
Chloride: 104 mEq/L (ref 98–109)
Creatinine: 0.9 mg/dL (ref 0.7–1.3)
GLUCOSE: 90 mg/dL (ref 70–140)
Potassium: 3.9 mEq/L (ref 3.5–5.1)
SODIUM: 144 meq/L (ref 136–145)

## 2015-01-18 MED ORDER — IOHEXOL 300 MG/ML  SOLN
100.0000 mL | Freq: Once | INTRAMUSCULAR | Status: AC | PRN
  Administered 2015-01-18: 100 mL via INTRAVENOUS

## 2015-01-19 ENCOUNTER — Other Ambulatory Visit: Payer: Self-pay | Admitting: *Deleted

## 2015-01-19 ENCOUNTER — Telehealth: Payer: Self-pay | Admitting: Oncology

## 2015-01-19 ENCOUNTER — Ambulatory Visit: Payer: Medicare Other | Admitting: Oncology

## 2015-01-19 ENCOUNTER — Telehealth: Payer: Self-pay

## 2015-01-19 NOTE — Telephone Encounter (Signed)
Called pt back, Pedro Palmer, answered phone. Gave them dr Alen Blew message. POF to scheduler for r/s.

## 2015-01-19 NOTE — Telephone Encounter (Signed)
Please let him know that his scan looks good. He needs to reschedule his appointment at his convenience.

## 2015-01-19 NOTE — Telephone Encounter (Signed)
perp of to sch pt appt-sch & cld & left pt a message w/time & date of r/s appt

## 2015-01-19 NOTE — Telephone Encounter (Signed)
Pt called requesting results of CT scan done yesterday. Noted he did not show for his MD appt today. Forwarded to Dr Alen Blew and Nyoka Cowden.

## 2015-01-20 ENCOUNTER — Ambulatory Visit: Payer: Medicare Other | Admitting: Oncology

## 2015-01-26 ENCOUNTER — Ambulatory Visit: Payer: Medicare Other | Admitting: Oncology

## 2015-08-11 ENCOUNTER — Telehealth: Payer: Self-pay | Admitting: Oncology

## 2015-08-11 NOTE — Telephone Encounter (Signed)
s.w.k pt and sched f/u appt.Marland KitchenMarland KitchenMarland KitchenMarland Kitchenpt ok and aware

## 2015-08-15 ENCOUNTER — Ambulatory Visit: Payer: Medicare Other | Admitting: Oncology

## 2015-08-22 ENCOUNTER — Telehealth: Payer: Self-pay | Admitting: Oncology

## 2015-08-22 NOTE — Telephone Encounter (Signed)
Pt came in to get schedule, lost his, advised him of his 2 missed apts and r/s pt gave him updated schedule. This f/u was to go over CT scan in Feb.... KJ

## 2015-10-04 ENCOUNTER — Ambulatory Visit: Payer: Medicare Other | Admitting: Oncology

## 2016-01-15 ENCOUNTER — Telehealth: Payer: Self-pay | Admitting: Oncology

## 2016-01-15 ENCOUNTER — Telehealth: Payer: Self-pay | Admitting: Hematology

## 2016-01-15 NOTE — Telephone Encounter (Signed)
DR POLITE OFFICE CALLED TO SCHEDULE A F/U APPT FOR COLON CANCER, DISCUSSED PTS NO SHOW HISTORY; INFORMED IF MISSED APPT FOR 2/20 CAN NOT BE SEEN HERE PRE POLICY

## 2016-01-15 NOTE — Telephone Encounter (Signed)
Lt mess for Dr. Delfina Redwood regarding pt's appointments for 2016 ( not shows or cancellations)  Due to Dr. Delfina Redwood call to schedule an appt to f/u on pt's colon cancer.

## 2016-01-29 ENCOUNTER — Ambulatory Visit: Payer: Medicare Other | Admitting: Hematology

## 2016-01-29 ENCOUNTER — Telehealth: Payer: Self-pay | Admitting: Oncology

## 2016-01-29 NOTE — Telephone Encounter (Signed)
Pt aware of canceled appt. On 01/29/16@2 :00

## 2016-01-31 ENCOUNTER — Telehealth: Payer: Self-pay | Admitting: Oncology

## 2016-01-31 ENCOUNTER — Other Ambulatory Visit: Payer: Self-pay | Admitting: Oncology

## 2016-01-31 NOTE — Telephone Encounter (Signed)
Lft msg with pt's daughter pt New Pt scheduler need to give information to daughter due to pt's condition. Mailed out schedule to pt.... KJ

## 2016-03-06 ENCOUNTER — Ambulatory Visit: Payer: Medicare Other | Admitting: Oncology

## 2016-07-12 ENCOUNTER — Telehealth: Payer: Self-pay | Admitting: Oncology

## 2016-07-12 NOTE — Telephone Encounter (Signed)
Spoke to Ms. Dora at [facility] from Dr. Lina Sar office and explained that due to the patient's no show's he can no longer be seen.

## 2017-08-15 ENCOUNTER — Telehealth: Payer: Self-pay | Admitting: Oncology

## 2017-08-15 NOTE — Telephone Encounter (Signed)
Left message for appt in October. Sending him a reminder letter in the mail.

## 2017-08-15 NOTE — Telephone Encounter (Signed)
Left voicemail for patient regarding his upcoming appt in October.

## 2017-10-01 ENCOUNTER — Ambulatory Visit (HOSPITAL_BASED_OUTPATIENT_CLINIC_OR_DEPARTMENT_OTHER): Payer: Medicare Other | Admitting: Oncology

## 2017-10-01 VITALS — BP 189/91 | HR 83 | Temp 98.2°F | Resp 18 | Ht 67.0 in | Wt 166.3 lb

## 2017-10-01 DIAGNOSIS — Z86711 Personal history of pulmonary embolism: Secondary | ICD-10-CM | POA: Diagnosis not present

## 2017-10-01 DIAGNOSIS — Z85038 Personal history of other malignant neoplasm of large intestine: Secondary | ICD-10-CM

## 2017-10-01 DIAGNOSIS — C189 Malignant neoplasm of colon, unspecified: Secondary | ICD-10-CM

## 2017-10-01 NOTE — Progress Notes (Signed)
Hematology and Oncology Follow Up Visit  Pedro Palmer [id number] 1933-10-01 83 y.o. 10/01/2017 1:25 PM Polite, Pedro Palmer, MDPolite, Pedro Moll, MD   Principle Diagnosis: 83 year old with colon cancer diagnosed in July 2011  Secondary Diagnosis:  1. Low grade GIST.  2. Bilateral pulmonary emboli in the post-op setting.  Prior Therapy: S/P left colectomy on 07/03/10 for a 4.5 cm moderately differentiated adenocarcinoma of the colon. 0/20 lymph nodes positive.   Current therapy:  Watchful observation for his colon cancer and GIST. He is off Coumadin for his pulmonary emboli since 01/2013.  Interim History:  Pedro Palmer returns for routine follow-up. Since the last visit, he reports no age or changes in his health. He denies weight loss, decreased appetite and fatigue. Denies chest pain, shortness of breath, and dyspnea. No abdominal pain, nausea, or vomiting. He has not noticed any blood in his stools. He is up-to-date on colonoscopies. His performance status and quality of life remains unchanged.  He does not report any headaches, blurry vision, syncope or seizures. He does not report any chest pain, palpitation orthopnea. He does not report any cough, wheezing or hemoptysis. He does not report any frequency urgency or hesitancy. He does not report any skeletal complaints. Remaining review of systems unremarkable.  Medications: I have reviewed the patient's current medications. Current Outpatient Prescriptions  Medication Sig Dispense Refill  . aspirin EC 81 MG tablet Take 81 mg by mouth daily.    Marland Kitchen lisinopril-hydrochlorothiazide (PRINZIDE,ZESTORETIC) 20-12.5 MG per tablet Take 1 tablet by mouth every morning.     . Multiple Vitamin (MULTIVITAMIN WITH MINERALS) TABS tablet Take 1 tablet by mouth daily.    . simvastatin (ZOCOR) 20 MG tablet Take 20 mg by mouth every evening.     No current facility-administered medications for this visit.     Allergies: No Known Allergies  Past Medical History,  Surgical history, Social history, and Family History were reviewed and updated.  Review of Systems: Constitutional:  Negative for fever, chills, night sweats, anorexia, weight loss, pain.  Remaining ROS negative.  Physical Exam: Blood pressure (!) 189/91, pulse 83, temperature 98.2 F (36.8 C), temperature source Oral, resp. rate 18, height 5\' 7"  (1.702 m), weight 166 lb 4.8 oz (75.4 kg), SpO2 99 %. ECOG: 1 General appearance: alert and cooperative appeared without distress. Head: Normocephalic, without obvious abnormality no oral thrush or ulcers. Neck: no adenopathy,  Lymph nodes: Cervical, supraclavicular, and axillary nodes normal. Heart:regular rate and rhythm, S1, S2 normal, no murmur, click, rub or gallop Lung:chest clear, no wheezing, rales, normal symmetric air entry. Abdomen: soft, non-tender, without masses or organomegaly EXT:no erythema, induration, or nodules   Lab Results: Lab Results  Component Value Date   WBC 5.4 01/12/2014   HGB 16.0 01/12/2014   HCT 48.3 01/12/2014   MCV 98.3 (H) 01/12/2014   PLT 178 01/12/2014     Chemistry      Component Value Date/Time   NA 144 01/18/2015 1429   K 3.9 01/18/2015 1429   CL 103 01/18/2013 0912   CO2 30 (H) 01/18/2015 1429   BUN 12.7 01/18/2015 1429   CREATININE 0.9 01/18/2015 1429      Component Value Date/Time   CALCIUM 10.3 01/18/2015 1429   ALKPHOS 332 (H) 01/12/2014 1300   AST 19 01/12/2014 1300   ALT 19 01/12/2014 1300   BILITOT 0.60 01/12/2014 1300       Impression and Plan: 83 year old gentleman with the following issues:  1. Colon cancer. S/P left hemicolectomy  in July 2011. He is currently on observation and surveillance without any evidence of recurrent disease. CT scan obtained in 2016 showed no evidence of relapse.  At this point, he does not require any further oncology follow-up given the low likelihood of cancer recurrence.  2. Low grade GIST. S/P excision. He has no evidence of recurrence  on CT scans. Very low risk of recurrence and no imaging studies is needed.  3. History of pulmonary emboli. No recent relapse.  4. Follow-up. No routine oncology follow-up is needed. I am happy to see him in the future as needed.     Pedro Palmer 10/24/20181:25 PM

## 2018-01-27 ENCOUNTER — Ambulatory Visit
Admission: RE | Admit: 2018-01-27 | Discharge: 2018-01-27 | Disposition: A | Discharge status: Home / Self Care | Payer: Medicare Other | Source: Ambulatory Visit | Attending: Internal Medicine | Admitting: Internal Medicine

## 2018-01-27 ENCOUNTER — Other Ambulatory Visit: Payer: Self-pay | Admitting: Internal Medicine

## 2018-01-27 DIAGNOSIS — M545 Low back pain: Secondary | ICD-10-CM

## 2018-02-11 ENCOUNTER — Encounter: Payer: Self-pay | Admitting: Podiatry

## 2018-02-11 ENCOUNTER — Ambulatory Visit: Payer: Medicare Other | Admitting: Podiatry

## 2018-02-11 DIAGNOSIS — B351 Tinea unguium: Secondary | ICD-10-CM | POA: Diagnosis not present

## 2018-02-11 DIAGNOSIS — M79675 Pain in left toe(s): Secondary | ICD-10-CM

## 2018-02-11 DIAGNOSIS — M79674 Pain in right toe(s): Secondary | ICD-10-CM

## 2018-02-11 NOTE — Progress Notes (Signed)
Subjective:    Patient ID: Pedro Palmer, male    DOB: [birth year], 84 y.o.   MRN: [id number]  HPI 84 year old male presents to the office today for concerns of thick, painful, elongated toenails that he cannot trim himself.  The cause irritation side shoes and pressure.  Denies any redness or drainage or any swelling.  He denies any numbness or tingling denies any claudication symptoms.  He has no other complaints today.   Review of Systems  All other systems reviewed and are negative.  Past Medical History:  Diagnosis Date  . Arthritis   . Colon cancer ([place]) dx'd 04/2010  . Hypercholesteremia   . Hypertension   . Paget disease of bone   . Pulmonary emboli ([place]) 2011 or 2012    Past Surgical History:  Procedure Laterality Date  . APPENDECTOMY    . COLONOSCOPY WITH PROPOFOL N/A 07/25/2014   Procedure: COLONOSCOPY WITH PROPOFOL;  Surgeon: Garlan Fair, MD;  Location: [facility];  Service: Endoscopy;  Laterality: N/A;  . [place]  . KELOID EXCISION    . LEFT COLECTOMY  07/03/2010     Current Outpatient Medications:  .  aspirin EC 81 MG tablet, Take 81 mg by mouth daily., Disp: , Rfl:  .  lisinopril-hydrochlorothiazide (PRINZIDE,ZESTORETIC) 20-12.5 MG per tablet, Take 1 tablet by mouth every morning. , Disp: , Rfl:  .  Multiple Vitamin (MULTIVITAMIN WITH MINERALS) TABS tablet, Take 1 tablet by mouth daily., Disp: , Rfl:  .  simvastatin (ZOCOR) 20 MG tablet, Take 20 mg by mouth every evening., Disp: , Rfl:   No Known Allergies  Social History   Socioeconomic History  . Marital status: Widowed    Spouse name: Not on file  . Number of children: Not on file  . Years of education: Not on file  . Highest education level: Not on file  Social Needs  . Financial resource strain: Not on file  . Food insecurity - worry: Not on file  . Food insecurity - inability: Not on file  . Transportation needs - medical: Not on file  . Transportation needs - non-medical:  Not on file  Occupational History  . Not on file  Tobacco Use  . Smoking status: Never Smoker  . Smokeless tobacco: Never Used  Substance and Sexual Activity  . Alcohol use: No  . Drug use: No  . Sexual activity: Not on file  Other Topics Concern  . Not on file  Social History Narrative  . Not on file        Objective:   Physical Exam  General: AAO x3, NAD  Dermatological: Nails are hypertrophic, dystrophic, brittle, discolored, elongated 10. No surrounding redness or drainage. Tenderness nails 1-5 bilaterally. No open lesions or pre-ulcerative lesions are identified today.  Vascular: Dorsalis Pedis artery and Posterior Tibial artery pedal pulses are 2/4 bilateral with immedate capillary fill time.  There is no pain with calf compression, swelling, warmth, erythema.   Neruologic: Grossly intact via light touch bilateral. Vibratory intact via tuning fork bilateral. Protective threshold with Semmes Wienstein monofilament intact to all pedal sites bilateral.   Musculoskeletal: No gross boney pedal deformities bilateral. No pain, crepitus, or limitation noted with foot and ankle range of motion bilateral. Muscular strength 5/5 in all groups tested bilateral.     Assessment & Plan:  84 year old male with symptomatic onychomycosis -Treatment options discussed including all alternatives, risks, and complications -Etiology of symptoms were discussed -Nails debrided 10 without complications or  bleeding. -His wife is asking about the thickening the nails.  I think that this is likely more due to damage in combination with nail fungus.  We discussed options and she is in start with some natural ointments including coconut oil/tea tree oil to the nails. -Daily foot inspection -Follow-up in 3 months or sooner if any problems arise. In the meantime, encouraged to call the office with any questions, concerns, change in symptoms.   Celesta Gentile, DPM

## 2018-05-14 ENCOUNTER — Ambulatory Visit: Payer: Medicare Other | Admitting: Podiatry

## 2018-05-22 ENCOUNTER — Ambulatory Visit: Payer: Medicare Other | Admitting: Podiatry

## 2018-05-22 ENCOUNTER — Encounter: Payer: Self-pay | Admitting: Podiatry

## 2018-05-22 DIAGNOSIS — M79675 Pain in left toe(s): Secondary | ICD-10-CM | POA: Diagnosis not present

## 2018-05-22 DIAGNOSIS — M79674 Pain in right toe(s): Secondary | ICD-10-CM

## 2018-05-22 DIAGNOSIS — B351 Tinea unguium: Secondary | ICD-10-CM | POA: Diagnosis not present

## 2018-05-24 ENCOUNTER — Encounter: Payer: Self-pay | Admitting: Podiatry

## 2018-05-24 NOTE — Progress Notes (Signed)
Subjective: Pedro Palmer is an 84 y.o. AAM who today for follow up for cc of painful, discolored, thick toenails which interfere with activities of daily living.   Pain is aggravated when wearing enclosed shoe gear. Pain is getting progressively worse and relieved with periodic professional debridement.  Objective: Vascular Examination: Capillary refill time <3 seconds x 10 digits Dorsalis pedis and posterior tibial pulses present 2/4 b/l No digital hair x 10 digits Skin temperature warm to warm b/l  Dermatological Examination: Skin with normal turgor, texture and tone Toenails 1-5 b/l discolored, thick, dystrophic with subungual debris and pain with palpation to nailbeds due to thickness of nails.  Musculoskeletal: Muscle strength 5/5 to all LE muscle groups  Neurological: Sensation intact with 10 gram monofilament b/l Vibratory sensation intact b/l  Assessment: Painful onychomycosis toenails 1-5 b/l   Plan: 1. Toenails 1-5 b/l were debrided in length and girth without iatrogenic bleeding. 2. Patient to continue soft, supportive shoe gear 3. Patient to report any pedal injuries to medical professional immediately. 4. Follow up 3 months.  5. Patient/POA to call should there be a concern in the interim.

## 2018-09-04 ENCOUNTER — Ambulatory Visit: Payer: Medicare Other | Admitting: Podiatry

## 2018-09-04 ENCOUNTER — Encounter: Payer: Self-pay | Admitting: Podiatry

## 2018-09-04 DIAGNOSIS — B351 Tinea unguium: Secondary | ICD-10-CM | POA: Diagnosis not present

## 2018-09-04 DIAGNOSIS — M79674 Pain in right toe(s): Secondary | ICD-10-CM

## 2018-09-04 DIAGNOSIS — M79675 Pain in left toe(s): Secondary | ICD-10-CM | POA: Diagnosis not present

## 2018-09-08 NOTE — Progress Notes (Signed)
Subjective: Pedro Palmer presents today for preventative care of painful, discolored, thick toenails which interfere with daily activities and routine tasks.  Pain is aggravated when wearing enclosed shoe gear and relieved with periodic professional debridement.  Objective: Vascular Examination: Capillary refill time <3 seconds x 10 digits Dorsalis pedis and posterior tibial pulses present b/l No digital hair x 10 digits Skin temperature warm to warm b/l  Dermatological Examination: Skin with normal turgor, texture and tone b/l Toenails 1-5 b/l discolored, thick, dystrophic with subungual debris and pain with palpation to nailbeds due to thickness of nails.  Musculoskeletal: Muscle strength 5/5 to all LE muscle groups  Neurological: Sensation intact with 10 gram monofilament. Vibratory sensation intact.  Assessment: Painful onychomycosis toenails 1-5 b/l   Plan: 1. Toenails 1-5 b/l were debrided in length and girth without iatrogenic bleeding. 2. Patient to continue soft, supportive shoe gear 3. Patient to report any pedal injuries to medical professional immediately. 4. Follow up 3 months.  5. Patient/POA to call should there be a concern in the interim.

## 2018-12-04 ENCOUNTER — Ambulatory Visit: Payer: Medicare Other | Admitting: Podiatry

## 2018-12-13 ENCOUNTER — Emergency Department (HOSPITAL_COMMUNITY): Payer: Medicare Other

## 2018-12-13 ENCOUNTER — Inpatient Hospital Stay (HOSPITAL_COMMUNITY)
Admission: EM | Admit: 2018-12-13 | Discharge: 2018-12-23 | DRG: 871 | Disposition: A | Discharge status: Skilled Nursing Facility | Payer: Medicare Other | Attending: Family Medicine | Admitting: Family Medicine

## 2018-12-13 ENCOUNTER — Encounter (HOSPITAL_COMMUNITY): Payer: Self-pay | Admitting: *Deleted

## 2018-12-13 ENCOUNTER — Other Ambulatory Visit: Payer: Self-pay

## 2018-12-13 DIAGNOSIS — E7800 Pure hypercholesterolemia, unspecified: Secondary | ICD-10-CM | POA: Diagnosis present

## 2018-12-13 DIAGNOSIS — A411 Sepsis due to other specified staphylococcus: Principal | ICD-10-CM | POA: Diagnosis present

## 2018-12-13 DIAGNOSIS — M889 Osteitis deformans of unspecified bone: Secondary | ICD-10-CM | POA: Diagnosis present

## 2018-12-13 DIAGNOSIS — N3001 Acute cystitis with hematuria: Secondary | ICD-10-CM | POA: Diagnosis not present

## 2018-12-13 DIAGNOSIS — Z79899 Other long term (current) drug therapy: Secondary | ICD-10-CM | POA: Diagnosis not present

## 2018-12-13 DIAGNOSIS — N179 Acute kidney failure, unspecified: Secondary | ICD-10-CM | POA: Diagnosis present

## 2018-12-13 DIAGNOSIS — N390 Urinary tract infection, site not specified: Secondary | ICD-10-CM | POA: Diagnosis present

## 2018-12-13 DIAGNOSIS — R6520 Severe sepsis without septic shock: Secondary | ICD-10-CM | POA: Diagnosis present

## 2018-12-13 DIAGNOSIS — I10 Essential (primary) hypertension: Secondary | ICD-10-CM | POA: Diagnosis present

## 2018-12-13 DIAGNOSIS — M549 Dorsalgia, unspecified: Secondary | ICD-10-CM | POA: Diagnosis present

## 2018-12-13 DIAGNOSIS — G92 Toxic encephalopathy: Secondary | ICD-10-CM | POA: Diagnosis present

## 2018-12-13 DIAGNOSIS — N400 Enlarged prostate without lower urinary tract symptoms: Secondary | ICD-10-CM | POA: Diagnosis present

## 2018-12-13 DIAGNOSIS — R339 Retention of urine, unspecified: Secondary | ICD-10-CM | POA: Diagnosis not present

## 2018-12-13 DIAGNOSIS — R159 Full incontinence of feces: Secondary | ICD-10-CM | POA: Diagnosis present

## 2018-12-13 DIAGNOSIS — Z7982 Long term (current) use of aspirin: Secondary | ICD-10-CM | POA: Diagnosis not present

## 2018-12-13 DIAGNOSIS — E785 Hyperlipidemia, unspecified: Secondary | ICD-10-CM | POA: Diagnosis present

## 2018-12-13 DIAGNOSIS — K5900 Constipation, unspecified: Secondary | ICD-10-CM | POA: Diagnosis not present

## 2018-12-13 DIAGNOSIS — E861 Hypovolemia: Secondary | ICD-10-CM | POA: Diagnosis present

## 2018-12-13 DIAGNOSIS — Z85038 Personal history of other malignant neoplasm of large intestine: Secondary | ICD-10-CM | POA: Diagnosis not present

## 2018-12-13 DIAGNOSIS — A419 Sepsis, unspecified organism: Secondary | ICD-10-CM | POA: Diagnosis present

## 2018-12-13 DIAGNOSIS — Z86711 Personal history of pulmonary embolism: Secondary | ICD-10-CM

## 2018-12-13 DIAGNOSIS — E876 Hypokalemia: Secondary | ICD-10-CM | POA: Diagnosis present

## 2018-12-13 DIAGNOSIS — G8929 Other chronic pain: Secondary | ICD-10-CM | POA: Diagnosis present

## 2018-12-13 DIAGNOSIS — Z8546 Personal history of malignant neoplasm of prostate: Secondary | ICD-10-CM | POA: Diagnosis not present

## 2018-12-13 DIAGNOSIS — Z9049 Acquired absence of other specified parts of digestive tract: Secondary | ICD-10-CM | POA: Diagnosis not present

## 2018-12-13 LAB — URINALYSIS, ROUTINE W REFLEX MICROSCOPIC
Bilirubin Urine: NEGATIVE
Glucose, UA: NEGATIVE mg/dL
KETONES UR: NEGATIVE mg/dL
Nitrite: POSITIVE — AB
Protein, ur: NEGATIVE mg/dL
RBC / HPF: >50 RBC/hpf — ABNORMAL HIGH (ref 0–5)
Specific Gravity, Urine: 1.012 (ref 1.005–1.030)
pH: 6 (ref 5.0–8.0)

## 2018-12-13 LAB — I-STAT CHEM 8, ED
BUN: 23 mg/dL (ref 8–23)
CREATININE: 1.2 mg/dL (ref 0.61–1.24)
Calcium, Ion: 1.23 mmol/L (ref 1.15–1.40)
Chloride: 109 mmol/L (ref 98–111)
GLUCOSE: 182 mg/dL — AB (ref 70–99)
HCT: 45 % (ref 39.0–52.0)
Hemoglobin: 15.3 g/dL (ref 13.0–17.0)
Potassium: 3.3 mmol/L — ABNORMAL LOW (ref 3.5–5.1)
Sodium: 141 mmol/L (ref 135–145)
TCO2: 21 mmol/L — ABNORMAL LOW (ref 22–32)

## 2018-12-13 LAB — CBC WITH DIFFERENTIAL/PLATELET
Abs Immature Granulocytes: 0.04 10*3/uL (ref 0.00–0.07)
BASOS PCT: 0 %
Basophils Absolute: 0 10*3/uL (ref 0.0–0.1)
Eosinophils Absolute: 0 10*3/uL (ref 0.0–0.5)
Eosinophils Relative: 0 %
HCT: 50.1 % (ref 39.0–52.0)
HEMOGLOBIN: 16.1 g/dL (ref 13.0–17.0)
Immature Granulocytes: 0 %
Lymphocytes Relative: 3 %
Lymphs Abs: 0.3 10*3/uL — ABNORMAL LOW (ref 0.7–4.0)
MCH: 32.7 pg (ref 26.0–34.0)
MCHC: 32.1 g/dL (ref 30.0–36.0)
MCV: 101.6 fL — ABNORMAL HIGH (ref 80.0–100.0)
Monocytes Absolute: 0 10*3/uL — ABNORMAL LOW (ref 0.1–1.0)
Monocytes Relative: 0 %
Neutro Abs: 8.9 10*3/uL — ABNORMAL HIGH (ref 1.7–7.7)
Neutrophils Relative %: 97 %
Platelets: 119 10*3/uL — ABNORMAL LOW (ref 150–400)
RBC: 4.93 MIL/uL (ref 4.22–5.81)
RDW: 14.1 % (ref 11.5–15.5)
Smear Review: UNDETERMINED
WBC: 9.3 10*3/uL (ref 4.0–10.5)
nRBC: 0 % (ref 0.0–0.2)

## 2018-12-13 LAB — COMPREHENSIVE METABOLIC PANEL
ALT: 38 U/L (ref 0–44)
AST: 47 U/L — ABNORMAL HIGH (ref 15–41)
Albumin: 4 g/dL (ref 3.5–5.0)
Alkaline Phosphatase: 253 U/L — ABNORMAL HIGH (ref 38–126)
Anion gap: 9 (ref 5–15)
BILIRUBIN TOTAL: 1 mg/dL (ref 0.3–1.2)
BUN: 23 mg/dL (ref 8–23)
CO2: 24 mmol/L (ref 22–32)
Calcium: 9.7 mg/dL (ref 8.9–10.3)
Chloride: 106 mmol/L (ref 98–111)
Creatinine, Ser: 1.36 mg/dL — ABNORMAL HIGH (ref 0.61–1.24)
GFR calc Af Amer: 55 mL/min — ABNORMAL LOW (ref >60)
GFR calc non Af Amer: 48 mL/min — ABNORMAL LOW (ref >60)
Glucose, Bld: 162 mg/dL — ABNORMAL HIGH (ref 70–99)
POTASSIUM: 3.6 mmol/L (ref 3.5–5.1)
Sodium: 139 mmol/L (ref 135–145)
TOTAL PROTEIN: 6.7 g/dL (ref 6.5–8.1)

## 2018-12-13 LAB — I-STAT CG4 LACTIC ACID, ED
Lactic Acid, Venous: 2.35 mmol/L (ref 0.5–1.9)
Lactic Acid, Venous: 1.9 mmol/L (ref 0.5–1.9)

## 2018-12-13 LAB — INFLUENZA PANEL BY PCR (TYPE A & B)
Influenza A By PCR: NEGATIVE
Influenza B By PCR: NEGATIVE

## 2018-12-13 MED ORDER — SODIUM CHLORIDE 0.9 % IV SOLN
500.0000 mg | INTRAVENOUS | Status: DC
  Administered 2018-12-13: 500 mg via INTRAVENOUS
  Filled 2018-12-13: qty 500

## 2018-12-13 MED ORDER — SODIUM CHLORIDE 0.9 % IV SOLN
INTRAVENOUS | Status: AC
  Administered 2018-12-14 (×2): via INTRAVENOUS

## 2018-12-13 MED ORDER — SODIUM CHLORIDE 0.9 % IV BOLUS (SEPSIS)
1000.0000 mL | Freq: Once | INTRAVENOUS | Status: AC
  Administered 2018-12-13: 1000 mL via INTRAVENOUS

## 2018-12-13 MED ORDER — SODIUM CHLORIDE 0.9 % IV SOLN
1.0000 g | Freq: Two times a day (BID) | INTRAVENOUS | Status: DC
  Administered 2018-12-14 – 2018-12-16 (×6): 1 g via INTRAVENOUS
  Filled 2018-12-13 (×7): qty 1

## 2018-12-13 MED ORDER — ENOXAPARIN SODIUM 40 MG/0.4ML ~~LOC~~ SOLN
40.0000 mg | SUBCUTANEOUS | Status: DC
  Administered 2018-12-14 – 2018-12-23 (×10): 40 mg via SUBCUTANEOUS
  Filled 2018-12-13 (×10): qty 0.4

## 2018-12-13 MED ORDER — OSELTAMIVIR PHOSPHATE 75 MG PO CAPS
75.0000 mg | ORAL_CAPSULE | Freq: Once | ORAL | Status: AC
  Administered 2018-12-13: 75 mg via ORAL
  Filled 2018-12-13: qty 1

## 2018-12-13 MED ORDER — ACETAMINOPHEN 325 MG PO TABS
650.0000 mg | ORAL_TABLET | Freq: Four times a day (QID) | ORAL | Status: DC | PRN
  Administered 2018-12-14 – 2018-12-20 (×4): 650 mg via ORAL
  Filled 2018-12-13 (×4): qty 2

## 2018-12-13 MED ORDER — SODIUM CHLORIDE 0.9 % IV SOLN
2.0000 g | INTRAVENOUS | Status: DC
  Administered 2018-12-13: 2 g via INTRAVENOUS
  Filled 2018-12-13: qty 20

## 2018-12-13 MED ORDER — ACETAMINOPHEN 500 MG PO TABS
1000.0000 mg | ORAL_TABLET | Freq: Once | ORAL | Status: AC
  Administered 2018-12-13: 1000 mg via ORAL
  Filled 2018-12-13: qty 2

## 2018-12-13 NOTE — ED Triage Notes (Signed)
Spouse stated "this was a sudden thing, he is weak, didn't eat dinner.  He had wet himself before we came."  Pt is disoriented to day (Thurs) and unable to specify year.

## 2018-12-13 NOTE — H&P (Signed)
History and Physical  Pedro Palmer CHY:[id number] DOB: [birth year] DOA: 12/13/2018 2111  Referring physician: Allie Bossier PCP: Seward Carol, MD   HISTORY   Chief Complaint: altered mentation, concern for sepsis  HPI: Pedro Palmer is a 85 y.o. male  with hx of colon cancer, remote hx of PE, prostate CA, HTN, HLD, reported hx of Paget's, who presents with acute altered mentation that began this evening. History per wife, who noted that patient was in usual state of health until this evening, when he was unresponsive. Found him slumped over and had urinary and bowel incontinence. Reported foul smelling urine. Wife does think that patient has been urinating more frequently over the past week. Otherwise, patient has chronic intermittent back pain which he reported earlier today, although is currently denying new back pain during my exam. No respiratory sx or recent sick contacts. Denies hx of bladder infections.    Review of Systems:  + possible increased urinary frequency (per wife) - no cough - no chest pain, dyspnea on exertion - no edema, PND, orthopnea - no nausea/vomiting; no tarry, melanotic or bloody stools - no weight changes  Rest of systems reviewed are negative, except as per above history.   ED course:  Vitals Blood pressure 101/64, pulse (!) 114, temperature 98.5 F (36.9 C), temperature source Oral, resp. rate 17, height 5\' 7"  (1.702 m), weight 61.2 kg, SpO2 98 %. Received azithromcyin and ceftriaxone; NS bolus x 2L; tylenol 1gram  Past Medical History:  Diagnosis Date  . Arthritis   . Colon cancer ([place]) dx'd 04/2010  . Hypercholesteremia   . Hypertension   . Paget disease of bone   . Pulmonary emboli ([place]) 2011 or 2012   Past Surgical History:  Procedure Laterality Date  . APPENDECTOMY    . COLONOSCOPY WITH PROPOFOL N/A 07/25/2014   Procedure: COLONOSCOPY WITH PROPOFOL;  Surgeon: Garlan Fair, MD;  [facility];  Service: Endoscopy;  Laterality: N/A;  .  [place]  . KELOID EXCISION    . LEFT COLECTOMY  07/03/2010    Social History:  reports that he has never smoked. He has never used smokeless tobacco. He reports that he does not drink alcohol or use drugs.  No Known Allergies  No family history on file.    Prior to Admission medications   Medication Sig Start Date End Date Taking? Authorizing Provider  aspirin EC 81 MG tablet Take 81 mg by mouth daily.   Yes [provider]  lisinopril (PRINIVIL,ZESTRIL) 20 MG tablet Take 20 mg by mouth every evening.   Yes [provider]  Multiple Vitamin (MULTIVITAMIN WITH MINERALS) TABS tablet Take 1 tablet by mouth daily.   Yes [provider]  simvastatin (ZOCOR) 20 MG tablet Take 20 mg by mouth daily after breakfast.    Yes [provider]    PHYSICAL EXAM   Temp:  [98.5 F (36.9 C)-105.4 F (40.8 C)] 98.5 F (36.9 C) (01/05 2339) Pulse Rate:  [114-147] 114 (01/05 2339) Resp:  [17-25] 17 (01/05 2339) BP: (101-136)/(64-78) 101/64 (01/05 2339) SpO2:  [92 %-98 %] 98 % (01/05 2339) Weight:  [61.2 kg] 61.2 kg (01/05 2107)  BP 101/64 (BP Location: Right Arm)   Pulse (!) 114   Temp 98.5 F (36.9 C) (Oral)   Resp 17   Ht 5\' 7"  (1.702 m)   Wt 61.2 kg   SpO2 98%   BMI 21.14 kg/m    GEN thin elderly african-american male;  resting in bed, sitting upright  HEENT NCAT EOM intact PERRL; clear oropharynx, no cervical LAD; dry mucus membranes  JVP estimated 4 cm H2O above RA; no HJR ; no carotid bruits b/l ;  CV regular tachycardic; normal S1 and S2; no m/r/g or S3/S4; PMI non displaced; no parasternal heave  RESP CTA b/l; breathing unlabored and symmetric  ABD soft obese NT ND +normoactive BS; no CVA tenderness; well healed vertical scar  EXT warm throughout b/l; no peripheral edema b/l  PULSES  DP and radials 2+ intact b/l  SKIN/MSK no rashes or lesions  NEURO/PSYCH AAOx4; no focal deficits   DATA   LABS ON ADMISSION:  Basic  Metabolic Panel: Recent Labs  Lab 12/13/18 2125 12/13/18 2306  NA 139 141  K 3.6 3.3*  CL 106 109  CO2 24  --   GLUCOSE 162* 182*  BUN 23 23  CREATININE 1.36* 1.20  CALCIUM 9.7  --    CBC: Recent Labs  Lab 12/13/18 2125 12/13/18 2306  WBC 9.3  --   NEUTROABS 8.9*  --   HGB 16.1 15.3  HCT 50.1 45.0  MCV 101.6*  --   PLT 119*  --    Liver Function Tests: Recent Labs  Lab 12/13/18 2125  AST 47*  ALT 38  ALKPHOS 253*  BILITOT 1.0  PROT 6.7  ALBUMIN 4.0   No results for input(s): LIPASE, AMYLASE in the last 168 hours. No results for input(s): AMMONIA in the last 168 hours. Coagulation:   No results found for: PTT Lactic Acid, Venous:     Component Value Date/Time   LATICACIDVEN 1.90 12/13/2018 2305   Cardiac Enzymes: No results for input(s): CKTOTAL, CKMB, CKMBINDEX, TROPONINI in the last 168 hours. Urinalysis:    Component Value Date/Time   COLORURINE YELLOW 12/13/2018 2125   APPEARANCEUR CLOUDY (A) 12/13/2018 2125   LABSPEC 1.012 12/13/2018 2125   PHURINE 6.0 12/13/2018 2125   GLUCOSEU NEGATIVE 12/13/2018 2125   HGBUR LARGE (A) 12/13/2018 2125   BILIRUBINUR NEGATIVE 12/13/2018 2125   KETONESUR NEGATIVE 12/13/2018 2125   PROTEINUR NEGATIVE 12/13/2018 2125   NITRITE POSITIVE (A) 12/13/2018 2125   LEUKOCYTESUR LARGE (A) 12/13/2018 2125    BNP (last 3 results) No results for input(s): PROBNP in the last 8760 hours. CBG: No results for input(s): GLUCAP in the last 168 hours.  Radiological Exams on Admission: Dg Chest Port 1 View  Result Date: 12/13/2018 CLINICAL DATA:  Cough, fever and weakness EXAM: PORTABLE CHEST 1 VIEW COMPARISON:  CT chest 01/18/2015, CXR 01/05/2014 FINDINGS: AP portable semi upright view of the chest. Low lung volumes are noted. Borderline cardiomegaly with tortuous thoracic aorta. Mild central vascular congestion without acute pulmonary consolidation, effusion or pneumothorax. No acute nor aggressive osseous abnormality.  IMPRESSION: Low lung volumes with borderline cardiomegaly and mild central vascular congestion. Electronically Signed   By: Ashley Royalty M.D.   On: 12/13/2018 22:25    EKG:  pending  I have reviewed the patient's previous electronic chart records, labs, and other data.   ASSESSMENT AND PLAN   Assessment: Pedro Palmer is a 85 y.o. male with hx of colon cancer, remote hx of PE, prostate CA, HTN, HLD, reported hx of Paget's, who presents with acute altered mentation and fever, concerning for sepsis. At this time, history and urinalysis suggestive of urinary source for infection. Tachycardic with borderline BP. He did defervesce with tylenol. Will admit to stepdown and continue infectious workup. No convincing respiratory sx at this time.  Active Problems:   Sepsis ([place])   Plan:   # Sepsis, likely urinary source - blood and urine cultures pending - s/p azithro and ceftriaxone IV (D1 12/13/18) - switching to cefepime for better GNR coverage (D1 12/14/17) - IVF @ 125 cc/hr - spot fluid bolus as needed (received 2L in ED) - stepdown unit - tylenol prn fever - plan to remove foley in AM and bladder scan  # HTN and HLD - holding home lisinopril - holding aspirin - holding home statin  # Mild AKI, likely pre-renal in setting of infection vs obstructive > Cr 1.2 (baseline Cr < 1.0) - if not improve with IVF and abx, low threshold for renal US  # Hypokalemia K 3.3  - repleting overnight with 30 mEq     DVT Prophylaxis: lovenox Code Status:  Full Code Family Communication: discussed with wife at bedside Disposition Plan: admit to stepdown    Patient contact: Extended Emergency Contact Information Primary Emergency Contact: [place] Phone: [phone] Relation: Spouse  Time spent: > 35 mins  Colbert Ewing, MD [facility] Pager [phone]  If 7PM-7AM, please contact night-coverage [email or web address] Password [facility] 12/13/2018, 11:54 PM

## 2018-12-13 NOTE — ED Provider Notes (Addendum)
[facility] DEPT Provider Note   CSN: [id number] Arrival date & time: 12/13/18  2043     History   Chief Complaint Chief Complaint  Patient presents with  . Weakness  . Fever    HPI Pedro Palmer is a 85 y.o. male history of previous PE, prostate and colon cancer, here presenting with altered mental status, chills.  Patient was eating breakfast normally this morning.  Around 6 PM, the wife called him and he did not respond.  She then went in to see him in his room and he apparently was feeling weak and chills.  He was complaining of some back pain and she noticed that he urinated on himself.  Denies any falls.  Wife did not note that he had a fever and he was noted to be febrile and tachycardic.  The history is provided by the patient and a relative.  Level V caveat- AMS   Past Medical History:  Diagnosis Date  . Arthritis   . Colon cancer ([place]) dx'd 04/2010  . Hypercholesteremia   . Hypertension   . Paget disease of bone   . Pulmonary emboli ([place]) 2011 or 2012    Patient Active Problem List   Diagnosis Date Noted  . Sepsis ([place]) 12/13/2018  . Paget disease of bone   . Colon cancer ([place]) 07/21/2012  . Hypertension 07/21/2012    Past Surgical History:  Procedure Laterality Date  . APPENDECTOMY    . COLONOSCOPY WITH PROPOFOL N/A 07/25/2014   Procedure: COLONOSCOPY WITH PROPOFOL;  Surgeon: Garlan Fair, MD;  Location: [facility];  Service: Endoscopy;  Laterality: N/A;  . [place]  . KELOID EXCISION    . LEFT COLECTOMY  07/03/2010        Home Medications    Prior to Admission medications   Medication Sig Start Date End Date Taking? Authorizing Provider  aspirin EC 81 MG tablet Take 81 mg by mouth daily.   Yes [provider]  lisinopril (PRINIVIL,ZESTRIL) 20 MG tablet Take 20 mg by mouth every evening.   Yes [provider]  Multiple Vitamin (MULTIVITAMIN WITH MINERALS) TABS tablet Take 1 tablet  by mouth daily.   Yes [provider]  simvastatin (ZOCOR) 20 MG tablet Take 20 mg by mouth daily after breakfast.    Yes [provider]    Family History No family history on file.  Social History Social History   Tobacco Use  . Smoking status: Never Smoker  . Smokeless tobacco: Never Used  Substance Use Topics  . Alcohol use: No  . Drug use: No     Allergies   Patient has no known allergies.   Review of Systems Review of Systems  Constitutional: Positive for fever.  Neurological: Positive for weakness.  All other systems reviewed and are negative.    Physical Exam Updated Vital Signs BP 101/64 (BP Location: Right Arm)   Pulse (!) 114   Temp 98.5 F (36.9 C) (Oral)   Resp 17   Ht 5\' 7"  (1.702 m)   Wt 61.2 kg   SpO2 98%   BMI 21.14 kg/m   Physical Exam Vitals signs and nursing note reviewed.  Constitutional:      Comments: Ill appearing   HENT:     Head: Normocephalic.     Nose: Nose normal.     Mouth/Throat:     Mouth: Mucous membranes are dry.  Eyes:     Extraocular Movements: Extraocular movements intact.  Pupils: Pupils are equal, round, and reactive to light.  Neck:     Musculoskeletal: Normal range of motion and neck supple.  Cardiovascular:     Comments: Tachycardic  Pulmonary:     Comments: Slightly tachypneic, diminished bilaterally but no crackles  Abdominal:     General: Abdomen is flat.     Palpations: Abdomen is soft.  Genitourinary:    Comments: Brown stool in the vault, no obvious skin breakdown  Musculoskeletal: Normal range of motion.  Skin:    General: Skin is warm.  Neurological:     Mental Status: He is alert.     Comments: A & O x 2. Strength 4/5 throughout.   Psychiatric:     Comments: Unable       ED Treatments / Results  Labs (all labs ordered are listed, but only abnormal results are displayed) Labs Reviewed  COMPREHENSIVE METABOLIC PANEL - Abnormal; Notable for the following components:        Result Value   Glucose, Bld 162 (*)    Creatinine, Ser 1.36 (*)    AST 47 (*)    Alkaline Phosphatase 253 (*)    GFR calc non Af Amer 48 (*)    GFR calc Af Amer 55 (*)    All other components within normal limits  CBC WITH DIFFERENTIAL/PLATELET - Abnormal; Notable for the following components:   MCV 101.6 (*)    Platelets 119 (*)    Neutro Abs 8.9 (*)    Lymphs Abs 0.3 (*)    Monocytes Absolute 0.0 (*)    All other components within normal limits  URINALYSIS, ROUTINE W REFLEX MICROSCOPIC - Abnormal; Notable for the following components:   APPearance CLOUDY (*)    Hgb urine dipstick LARGE (*)    Nitrite POSITIVE (*)    Leukocytes, UA LARGE (*)    RBC / HPF >50 (*)    WBC, UA >50 (*)    Bacteria, UA RARE (*)    All other components within normal limits  I-STAT CG4 LACTIC ACID, ED - Abnormal; Notable for the following components:   Lactic Acid, Venous 2.35 (*)    All other components within normal limits  I-STAT CHEM 8, ED - Abnormal; Notable for the following components:   Potassium 3.3 (*)    Glucose, Bld 182 (*)    TCO2 21 (*)    All other components within normal limits  CULTURE, BLOOD (ROUTINE X 2)  CULTURE, BLOOD (ROUTINE X 2)  URINE CULTURE  INFLUENZA PANEL BY PCR (TYPE A & B)  CBC  CREATININE, SERUM  CBC WITH DIFFERENTIAL/PLATELET  BASIC METABOLIC PANEL  MAGNESIUM  I-STAT CG4 LACTIC ACID, ED  I-STAT CG4 LACTIC ACID, ED    EKG None  Radiology Dg Chest Port 1 View  Result Date: 12/13/2018 CLINICAL DATA:  Cough, fever and weakness EXAM: PORTABLE CHEST 1 VIEW COMPARISON:  CT chest 01/18/2015, CXR 01/05/2014 FINDINGS: AP portable semi upright view of the chest. Low lung volumes are noted. Borderline cardiomegaly with tortuous thoracic aorta. Mild central vascular congestion without acute pulmonary consolidation, effusion or pneumothorax. No acute nor aggressive osseous abnormality. IMPRESSION: Low lung volumes with borderline cardiomegaly and mild central  vascular congestion. Electronically Signed   By: Ashley Royalty M.D.   On: 12/13/2018 22:25    Procedures Procedures (including critical care time)  CRITICAL CARE Performed by: Wandra Arthurs   Total critical care time: 30 minutes  Critical care time was exclusive of separately billable procedures  and treating other patients.  Critical care was necessary to treat or prevent imminent or life-threatening deterioration.  Critical care was time spent personally by me on the following activities: development of treatment plan with patient and/or surrogate as well as nursing, discussions with consultants, evaluation of patient's response to treatment, examination of patient, obtaining history from patient or surrogate, ordering and performing treatments and interventions, ordering and review of laboratory studies, ordering and review of radiographic studies, pulse oximetry and re-evaluation of patient's condition.   Medications Ordered in ED Medications  cefTRIAXone (ROCEPHIN) 2 g in sodium chloride 0.9 % 100 mL IVPB (0 g Intravenous Stopped 12/13/18 2259)  enoxaparin (LOVENOX) injection 40 mg (has no administration in time range)  acetaminophen (TYLENOL) tablet 650 mg (has no administration in time range)  sodium chloride 0.9 % bolus 1,000 mL (0 mLs Intravenous Stopped 12/13/18 2257)    And  sodium chloride 0.9 % bolus 1,000 mL (1,000 mLs Intravenous Bolus 12/13/18 2247)  acetaminophen (TYLENOL) tablet 1,000 mg (1,000 mg Oral Given 12/13/18 2202)  oseltamivir (TAMIFLU) capsule 75 mg (75 mg Oral Given 12/13/18 2247)     Initial Impression / Assessment and Plan / ED Course  I have reviewed the triage vital signs and the nursing notes.  Pertinent labs & imaging results that were available during my care of the patient were reviewed by me and considered in my medical decision making (see chart for details).    Keny Donald is a 85 y.o. male here with fever, AMS. Patient febrile 105 in the ED. Also  tachycardic. Nonfocal neuro exam. No meningeal signs. He did urinate on himself and was noted to have foul smelling urine. Code sepsis initiated. Will get CBC, CMP, lactate, cultures, CXR, flu. Will treat for CAP empirically.   11:10 pm Lactate 2.3. WBC nl. Flu negative. UA + UTI. CXR clear. Blood and urine cultures sent. I am concerned that he is septic or bacteremic from UTI. Given 30 cc/kg bolus and HR in the 110s. Not in respiratory distress. His mental status improved. Will admit.    Final Clinical Impressions(s) / ED Diagnoses   Final diagnoses:  Acute cystitis with hematuria  Sepsis, due to unspecified organism, unspecified whether acute organ dysfunction present [facility]    ED Discharge Orders    None       Drenda Freeze, MD 12/13/18 2311    Drenda Freeze, MD 12/13/18 [phone]

## 2018-12-13 NOTE — ED Notes (Signed)
ED TO INPATIENT HANDOFF REPORT  Name/Age/Gender Pedro Palmer 85 y.o. male  Code Status    Code Status Orders  (From admission, onward)         Start     Ordered   12/13/18 2328  Full code  Continuous     12/13/18 2329        Code Status History    This patient has a current code status but no historical code status.      Home/SNF/Other Home  Chief Complaint Back Pain / Unable to Move Legs   Level of Care/Admitting Diagnosis ED Disposition    ED Disposition Condition [facility]: [place] [facility] [[id number]]  Level of Care: Stepdown [14]  Admit to SDU based on following criteria: Hemodynamic compromise or significant risk of instability:  Patient requiring short term acute titration and management of vasoactive drips, and invasive monitoring (i.e., CVP and Arterial line).  Diagnosis: Sepsis [facility]) [[id number]]  Admitting Physician: Colbert Ewing [[id number]]  Attending Physician: Colbert Ewing [[id number]]  Estimated length of stay: past midnight tomorrow  Certification:: I certify this patient will need inpatient services for at least 2 midnights  PT Class (Do Not Modify): Inpatient [101]  PT Acc Code (Do Not Modify): Private [1]       Medical History Past Medical History:  Diagnosis Date  . Arthritis   . Colon cancer ([place]) dx'd 04/2010  . Hypercholesteremia   . Hypertension   . Paget disease of bone   . Pulmonary emboli ([place]) 2011 or 2012    Allergies No Known Allergies  IV Location/Drains/Wounds Patient Lines/Drains/Airways Status   Active Line/Drains/Airways    Name:   Placement date:   Placement time:   Site:   Days:   Peripheral IV 12/13/18 Left;Posterior Wrist   12/13/18    2226    Wrist   less than 1   External Urinary Catheter   12/13/18    2156    -   less than 1   AIRWAYS   07/25/14    1118     1602          Labs/Imaging Results for orders placed or performed during the hospital encounter of 12/13/18 (from  the past 48 hour(s))  Comprehensive metabolic panel     Status: Abnormal   Collection Time: 12/13/18  9:25 PM  Result Value Ref Range   Sodium 139 135 - 145 mmol/L   Potassium 3.6 3.5 - 5.1 mmol/L   Chloride 106 98 - 111 mmol/L   CO2 24 22 - 32 mmol/L   Glucose, Bld 162 (H) 70 - 99 mg/dL   BUN 23 8 - 23 mg/dL   Creatinine, Ser 1.36 (H) 0.61 - 1.24 mg/dL   Calcium 9.7 8.9 - 10.3 mg/dL   Total Protein 6.7 6.5 - 8.1 g/dL   Albumin 4.0 3.5 - 5.0 g/dL   AST 47 (H) 15 - 41 U/L   ALT 38 0 - 44 U/L   Alkaline Phosphatase 253 (H) 38 - 126 U/L   Total Bilirubin 1.0 0.3 - 1.2 mg/dL   GFR calc non Af Amer 48 (L) >60 mL/min   GFR calc Af Amer 55 (L) >60 mL/min   Anion gap 9 5 - 15    Comment: Performed at [facility], [place] [address], [place], [place] [address]  CBC WITH DIFFERENTIAL     Status: Abnormal   Collection Time: 12/13/18  9:25 PM  Result Value Ref Range   WBC 9.3 4.0 - 10.5 K/uL   RBC 4.93 4.22 - 5.81 MIL/uL   Hemoglobin 16.1 13.0 - 17.0 g/dL   HCT 50.1 39.0 - 52.0 %   MCV 101.6 (H) 80.0 - 100.0 fL   MCH 32.7 26.0 - 34.0 pg   MCHC 32.1 30.0 - 36.0 g/dL   RDW 14.1 11.5 - 15.5 %   Platelets 119 (L) 150 - 400 K/uL    Comment: Immature Platelet Fraction may be clinically indicated, consider ordering this additional test [id number]    nRBC 0.0 0.0 - 0.2 %   Neutrophils Relative % 97 %   Neutro Abs 8.9 (H) 1.7 - 7.7 K/uL   Lymphocytes Relative 3 %   Lymphs Abs 0.3 (L) 0.7 - 4.0 K/uL   Monocytes Relative 0 %   Monocytes Absolute 0.0 (L) 0.1 - 1.0 K/uL   Eosinophils Relative 0 %   Eosinophils Absolute 0.0 0.0 - 0.5 K/uL   Basophils Relative 0 %   Basophils Absolute 0.0 0.0 - 0.1 K/uL   Smear Review PLATELET CLUMPS NOTED ON SMEAR, UNABLE TO ESTIMATE     Comment: PLATELETS APPEAR DECREASED   Immature Granulocytes 0 %   Abs Immature Granulocytes 0.04 0.00 - 0.07 K/uL    Comment: Performed at [facility], [place] [address] [place],  [place] [address]  Urinalysis, Routine w reflex microscopic     Status: Abnormal   Collection Time: 12/13/18  9:25 PM  Result Value Ref Range   Color, Urine YELLOW YELLOW   APPearance CLOUDY (A) CLEAR   Specific Gravity, Urine 1.012 1.005 - 1.030   pH 6.0 5.0 - 8.0   Glucose, UA NEGATIVE NEGATIVE mg/dL   Hgb urine dipstick LARGE (A) NEGATIVE   Bilirubin Urine NEGATIVE NEGATIVE   Ketones, ur NEGATIVE NEGATIVE mg/dL   Protein, ur NEGATIVE NEGATIVE mg/dL   Nitrite POSITIVE (A) NEGATIVE   Leukocytes, UA LARGE (A) NEGATIVE   RBC / HPF >50 (H) 0 - 5 RBC/hpf   WBC, UA >50 (H) 0 - 5 WBC/hpf   Bacteria, UA RARE (A) NONE SEEN   WBC Clumps PRESENT     Comment: Performed at [facility], [place] [address], [place], [place] [address]  Influenza panel by PCR (type A & B)     Status: None   Collection Time: 12/13/18  9:28 PM  Result Value Ref Range   Influenza A By PCR NEGATIVE NEGATIVE   Influenza B By PCR NEGATIVE NEGATIVE    Comment: (NOTE) The Xpert Xpress Flu assay is intended as an aid in the diagnosis of  influenza and should not be used as a sole basis for treatment.  This  assay is FDA approved for nasopharyngeal swab specimens only. Nasal  washings and aspirates are unacceptable for Xpert Xpress Flu testing. Performed at [facility], [place] [address], [place], [place] [address]   I-Stat CG4 Lactic Acid, ED     Status: Abnormal   Collection Time: 12/13/18 10:04 PM  Result Value Ref Range   Lactic Acid, Venous 2.35 (HH) 0.5 - 1.9 mmol/L   Comment NOTIFIED PHYSICIAN   I-Stat CG4 Lactic Acid, ED     Status: None   Collection Time: 12/13/18 11:05 PM  Result Value Ref Range   Lactic Acid, Venous 1.90 0.5 - 1.9 mmol/L  I-stat chem 8, ed     Status: Abnormal   Collection Time: 12/13/18 11:06 PM  Result Value  Ref Range   Sodium 141 135 - 145 mmol/L   Potassium 3.3 (L) 3.5 - 5.1 mmol/L   Chloride 109 98 - 111 mmol/L   BUN 23 8 - 23 mg/dL   Creatinine, Ser  1.20 0.61 - 1.24 mg/dL   Glucose, Bld 182 (H) 70 - 99 mg/dL   Calcium, Ion 1.23 1.15 - 1.40 mmol/L   TCO2 21 (L) 22 - 32 mmol/L   Hemoglobin 15.3 13.0 - 17.0 g/dL   HCT 45.0 39.0 - 52.0 %   Dg Chest Port 1 View  Result Date: 12/13/2018 CLINICAL DATA:  Cough, fever and weakness EXAM: PORTABLE CHEST 1 VIEW COMPARISON:  CT chest 01/18/2015, CXR 01/05/2014 FINDINGS: AP portable semi upright view of the chest. Low lung volumes are noted. Borderline cardiomegaly with tortuous thoracic aorta. Mild central vascular congestion without acute pulmonary consolidation, effusion or pneumothorax. No acute nor aggressive osseous abnormality. IMPRESSION: Low lung volumes with borderline cardiomegaly and mild central vascular congestion. Electronically Signed   By: Ashley Royalty M.D.   On: 12/13/2018 22:25   None  Pending Labs Unresulted Labs (From admission, onward)    Start     Ordered   12/20/18 0500  Creatinine, serum  (enoxaparin (LOVENOX)    CrCl >/= 30 ml/min)  Weekly,   R    Comments:  while on enoxaparin therapy    12/13/18 2329   12/14/18 0500  CBC with Differential/Platelet  Daily,   R     12/13/18 2329   12/14/18 5462  Basic metabolic panel  Daily,   R     12/13/18 2329   12/14/18 0500  Magnesium  Daily,   R     12/13/18 2329   12/13/18 2327  CBC  (enoxaparin (LOVENOX)    CrCl >/= 30 ml/min)  Once,   R    Comments:  Baseline for enoxaparin therapy IF NOT ALREADY DRAWN.  Notify MD if PLT < 100 K.    12/13/18 2329   12/13/18 2327  Creatinine, serum  (enoxaparin (LOVENOX)    CrCl >/= 30 ml/min)  Once,   R    Comments:  Baseline for enoxaparin therapy IF NOT ALREADY DRAWN.    12/13/18 2329   12/13/18 2125  Blood Culture (routine x 2)  BLOOD CULTURE X 2,   STAT     12/13/18 2126   12/13/18 2125  Urine culture  ONCE - STAT,   STAT     12/13/18 2126          Vitals/Pain Today's Vitals   12/13/18 2107 12/13/18 2136 12/13/18 2325 12/13/18 2339  BP:  136/78  101/64  Pulse:  (!) 121  (!)  114  Resp:  (!) 25  17  Temp:  (!) 105.4 F (40.8 C)  98.5 F (36.9 C)  TempSrc:  Rectal  Oral  SpO2:  92%  98%  Weight: 61.2 kg     Height: 5\' 7"  (1.702 m)     PainSc:   0-No pain     Isolation Precautions No active isolations  Medications Medications  enoxaparin (LOVENOX) injection 40 mg (has no administration in time range)  acetaminophen (TYLENOL) tablet 650 mg (has no administration in time range)  0.9 %  sodium chloride infusion (has no administration in time range)  ceFEPIme (MAXIPIME) 1 g in sodium chloride 0.9 % 100 mL IVPB (has no administration in time range)  sodium chloride 0.9 % bolus 1,000 mL (0 mLs Intravenous Stopped 12/13/18 2257)  And  sodium chloride 0.9 % bolus 1,000 mL (1,000 mLs Intravenous Bolus 12/13/18 2247)  acetaminophen (TYLENOL) tablet 1,000 mg (1,000 mg Oral Given 12/13/18 2202)  oseltamivir (TAMIFLU) capsule 75 mg (75 mg Oral Given 12/13/18 2247)    Mobility non-ambulatory

## 2018-12-13 NOTE — ED Notes (Signed)
[place] update number

## 2018-12-14 ENCOUNTER — Inpatient Hospital Stay (HOSPITAL_COMMUNITY): Payer: Medicare Other

## 2018-12-14 LAB — BASIC METABOLIC PANEL
Anion gap: 9 (ref 5–15)
BUN: 27 mg/dL — ABNORMAL HIGH (ref 8–23)
CO2: 22 mmol/L (ref 22–32)
Calcium: 8.7 mg/dL — ABNORMAL LOW (ref 8.9–10.3)
Chloride: 111 mmol/L (ref 98–111)
Creatinine, Ser: 1.45 mg/dL — ABNORMAL HIGH (ref 0.61–1.24)
GFR calc Af Amer: 51 mL/min — ABNORMAL LOW (ref >60)
GFR calc non Af Amer: 44 mL/min — ABNORMAL LOW (ref >60)
Glucose, Bld: 178 mg/dL — ABNORMAL HIGH (ref 70–99)
Potassium: 3.6 mmol/L (ref 3.5–5.1)
Sodium: 142 mmol/L (ref 135–145)

## 2018-12-14 LAB — CBC WITH DIFFERENTIAL/PLATELET
Abs Immature Granulocytes: 0.28 10*3/uL — ABNORMAL HIGH (ref 0.00–0.07)
Basophils Absolute: 0 10*3/uL (ref 0.0–0.1)
Basophils Relative: 0 %
Eosinophils Absolute: 0.1 10*3/uL (ref 0.0–0.5)
Eosinophils Relative: 0 %
HCT: 41.2 % (ref 39.0–52.0)
Hemoglobin: 13.2 g/dL (ref 13.0–17.0)
IMMATURE GRANULOCYTES: 2 %
Lymphocytes Relative: 2 %
Lymphs Abs: 0.4 10*3/uL — ABNORMAL LOW (ref 0.7–4.0)
MCH: 33.1 pg (ref 26.0–34.0)
MCHC: 32 g/dL (ref 30.0–36.0)
MCV: 103.3 fL — ABNORMAL HIGH (ref 80.0–100.0)
Monocytes Absolute: 0.5 10*3/uL (ref 0.1–1.0)
Monocytes Relative: 3 %
NEUTROS PCT: 93 %
Neutro Abs: 17.5 10*3/uL — ABNORMAL HIGH (ref 1.7–7.7)
Platelets: 121 10*3/uL — ABNORMAL LOW (ref 150–400)
RBC: 3.99 MIL/uL — ABNORMAL LOW (ref 4.22–5.81)
RDW: 14.5 % (ref 11.5–15.5)
WBC: 18.8 10*3/uL — ABNORMAL HIGH (ref 4.0–10.5)
nRBC: 0 % (ref 0.0–0.2)

## 2018-12-14 LAB — MAGNESIUM: Magnesium: 1.6 mg/dL — ABNORMAL LOW (ref 1.7–2.4)

## 2018-12-14 LAB — MRSA PCR SCREENING: MRSA by PCR: NEGATIVE

## 2018-12-14 MED ORDER — MAGNESIUM SULFATE 2 GM/50ML IV SOLN
2.0000 g | Freq: Once | INTRAVENOUS | Status: AC
  Administered 2018-12-14: 2 g via INTRAVENOUS
  Filled 2018-12-14: qty 50

## 2018-12-14 MED ORDER — SIMVASTATIN 20 MG PO TABS
20.0000 mg | ORAL_TABLET | Freq: Every day | ORAL | Status: DC
  Administered 2018-12-14 – 2018-12-23 (×10): 20 mg via ORAL
  Filled 2018-12-14: qty 2
  Filled 2018-12-14 (×7): qty 1
  Filled 2018-12-14: qty 2
  Filled 2018-12-14: qty 1

## 2018-12-14 MED ORDER — IOHEXOL 300 MG/ML  SOLN: 15.0000 mL | Freq: Once | INTRAMUSCULAR | Status: DC | PRN

## 2018-12-14 MED ORDER — SODIUM CHLORIDE 0.9 % IV SOLN
INTRAVENOUS | Status: AC
  Administered 2018-12-14 (×2): via INTRAVENOUS

## 2018-12-14 MED ORDER — POTASSIUM CHLORIDE CRYS ER 20 MEQ PO TBCR
30.0000 meq | EXTENDED_RELEASE_TABLET | Freq: Once | ORAL | Status: AC
  Administered 2018-12-14: 30 meq via ORAL
  Filled 2018-12-14: qty 1

## 2018-12-14 MED ORDER — ASPIRIN EC 81 MG PO TBEC
81.0000 mg | DELAYED_RELEASE_TABLET | Freq: Every day | ORAL | Status: DC
  Administered 2018-12-14 – 2018-12-23 (×10): 81 mg via ORAL
  Filled 2018-12-14 (×10): qty 1

## 2018-12-14 NOTE — Plan of Care (Signed)
IV fluids administered, lactic acid trending down. Blood pressure within normal range. Will continue to assess patient for signs and symptoms of sepsis and hemodynamic instability

## 2018-12-14 NOTE — Progress Notes (Signed)
PROGRESS NOTE    Pedro Palmer  BPZ:[id number] DOB: Apr 04, 1933 DOA: 12/13/2018 PCP: Seward Carol, MD   Brief Narrative:  85 year old with past medical history relevant for colon cancer status post hemicolectomy, remote history of pulmonary embolism not on anticoagulation, prostate cancer, hypertension, hyperlipidemia, Paget's disease of bone admitted with sepsis from urinary source, altered mental status.   Assessment & Plan:   Active Problems:   Sepsis ([place])   #) Altered mental status/sepsis from urinary source: Most likely source of sepsis is the urine is appears to be grossly abnormal.  Due to reported history of prostate cancer as well as possible BPH we will check a CT abdomen pelvis for possible source.  He is clinically improving somewhat but still is somewhat altered. - Continue IV cefepime started 12/13/2018 - Follow-up blood cultures ordered 12/13/2018 -Follow-up urine cultures ordered 12/13/2018  #) AKI: This appears to be slightly worsening despite IV resuscitation. -Imaging per above -Hold nephrotoxins  #) Hypertension/hyperlipidemia: -Hold home lisinopril 20 mg nightly - Restart aspirin 81 mg daily -Restart simvastatin 20 mg every morning  #) Remote history of PE: Currently patient is off all anticoagulation  #) Remote history of colon cancer status post hemicolectomy: Not an active problem  #) Prostate cancer: Not currently an active issue  #) Paget's disease of bone: Alk phos mildly elevated  Fluids: IV fluids Electrolytes: Monitor and supplement Nutrition: Heart healthy diet   Prophylaxis: Enoxaparin  Disposition: Pending resolution of sepsis  Full code    Consultants:   None  Procedures:   None  Antimicrobials:   IV cefepime started 12/13/2018   Subjective: This morning patient reports that he feels somewhat better.  He appears to be slowed and somewhat confused.  He reports some suprapubic pain as well as dysuria but denies any nausea,  vomiting, diarrhea, cough, congestion, rhinorrhea.  Objective: Vitals:   12/14/18 0304 12/14/18 0400 12/14/18 0600 12/14/18 0700  BP: (!) 102/56 (!) 101/57 112/65   Pulse: (!) 107 (!) 106 (!) 110   Resp: (!) 26 (!) 27 (!) 23   Temp: 98.4 F (36.9 C)   98.4 F (36.9 C)  TempSrc: Oral   Oral  SpO2: 96% 92% 98%   Weight:      Height:        Intake/Output Summary (Last 24 hours) at 12/14/2018 0900 Last data filed at 12/14/2018 0303 Gross per 24 hour  Intake 1595.47 ml  Output 0 ml  Net 1595.47 ml   Filed Weights   12/13/18 2107  Weight: 61.2 kg    Examination:  General exam: Mildly agitated Respiratory system: Clear to auscultation. Respiratory effort normal. Cardiovascular system: Tachycardic, regular rate and rhythm, no murmurs Gastrointestinal system: Soft, mildly distended, no rebound or guarding, suprapubic pain on palpation, hypoactive bowel sounds Central nervous system: Alert but oriented only to self and place not to time or situation, grossly intact, moving all extremities Extremities: No lower extremity edema Skin: No rashes over visible skin Psychiatry: Unable to assess due to medical condition.     Data Reviewed: I have personally reviewed following labs and imaging studies  CBC: Recent Labs  Lab 12/13/18 2125 12/13/18 2306 12/14/18 0333  WBC 9.3  --  18.8*  NEUTROABS 8.9*  --  17.5*  HGB 16.1 15.3 13.2  HCT 50.1 45.0 41.2  MCV 101.6*  --  103.3*  PLT 119*  --  242*   Basic Metabolic Panel: Recent Labs  Lab 12/13/18 2125 12/13/18 2306 12/14/18 0333  NA 139  141 142  K 3.6 3.3* 3.6  CL 106 109 111  CO2 24  --  22  GLUCOSE 162* 182* 178*  BUN 23 23 27*  CREATININE 1.36* 1.20 1.45*  CALCIUM 9.7  --  8.7*  MG  --   --  1.6*   GFR: Estimated Creatinine Clearance: 33.4 mL/min (A) (by C-G formula based on SCr of 1.45 mg/dL (H)). Liver Function Tests: Recent Labs  Lab 12/13/18 2125  AST 47*  ALT 38  ALKPHOS 253*  BILITOT 1.0  PROT 6.7    ALBUMIN 4.0   No results for input(s): LIPASE, AMYLASE in the last 168 hours. No results for input(s): AMMONIA in the last 168 hours. Coagulation Profile: No results for input(s): INR, PROTIME in the last 168 hours. Cardiac Enzymes: No results for input(s): CKTOTAL, CKMB, CKMBINDEX, TROPONINI in the last 168 hours. BNP (last 3 results) No results for input(s): PROBNP in the last 8760 hours. HbA1C: No results for input(s): HGBA1C in the last 72 hours. CBG: No results for input(s): GLUCAP in the last 168 hours. Lipid Profile: No results for input(s): CHOL, HDL, LDLCALC, TRIG, CHOLHDL, LDLDIRECT in the last 72 hours. Thyroid Function Tests: No results for input(s): TSH, T4TOTAL, FREET4, T3FREE, THYROIDAB in the last 72 hours. Anemia Panel: No results for input(s): VITAMINB12, FOLATE, FERRITIN, TIBC, IRON, RETICCTPCT in the last 72 hours. Sepsis Labs: Recent Labs  Lab 12/13/18 2204 12/13/18 2305  LATICACIDVEN 2.35* 1.90    Recent Results (from the past 240 hour(s))  MRSA PCR Screening     Status: None   Collection Time: 12/14/18 12:47 AM  Result Value Ref Range Status   MRSA by PCR NEGATIVE NEGATIVE Final    Comment:        The GeneXpert MRSA Assay (FDA approved for NASAL specimens only), is one component of a comprehensive MRSA colonization surveillance program. It is not intended to diagnose MRSA infection nor to guide or monitor treatment for MRSA infections. Performed at [facility], [place] [address] [place], [place] [address]          Radiology Studies: Dg Chest Port 1 View  Result Date: 12/13/2018 CLINICAL DATA:  Cough, fever and weakness EXAM: PORTABLE CHEST 1 VIEW COMPARISON:  CT chest 01/18/2015, CXR 01/05/2014 FINDINGS: AP portable semi upright view of the chest. Low lung volumes are noted. Borderline cardiomegaly with tortuous thoracic aorta. Mild central vascular congestion without acute pulmonary consolidation, effusion or  pneumothorax. No acute nor aggressive osseous abnormality. IMPRESSION: Low lung volumes with borderline cardiomegaly and mild central vascular congestion. Electronically Signed   By: Ashley Royalty M.D.   On: 12/13/2018 22:25        Scheduled Meds: . enoxaparin (LOVENOX) injection  40 mg Subcutaneous Q24H   Continuous Infusions: . sodium chloride 125 mL/hr at 12/14/18 0057  . ceFEPime (MAXIPIME) IV Stopped (12/14/18 0129)  . magnesium sulfate 1 - 4 g bolus IVPB 2 g (12/14/18 0809)     LOS: 1 day    Time spent: [place], MD [facility]  If 7PM-7AM, please contact night-coverage [email or web address] Password [email or web address] 12/14/2018, 9:00 AM

## 2018-12-14 NOTE — Progress Notes (Signed)
IV in left wrist infiltrated with normal saline running. Site is swollen and painful for patient. Warm compress applied, arm elevated,tylenol administered for pain. 20 gauge IV restarted on right FA will continue to monitor patient closely at this time

## 2018-12-14 NOTE — Plan of Care (Signed)
  Problem: Nutrition: Goal: Adequate nutrition will be maintained Outcome: Progressing   Problem: Urinary Elimination: Goal: Signs and symptoms of infection will decrease Outcome: Progressing   Problem: Elimination: Goal: Will not experience complications related to bowel motility Outcome: Progressing   Problem: Pain Managment: Goal: General experience of comfort will improve Outcome: Progressing

## 2018-12-15 LAB — CBC
HCT: 40.1 % (ref 39.0–52.0)
Hemoglobin: 13 g/dL (ref 13.0–17.0)
MCH: 32.7 pg (ref 26.0–34.0)
MCHC: 32.4 g/dL (ref 30.0–36.0)
MCV: 101 fL — ABNORMAL HIGH (ref 80.0–100.0)
Platelets: 101 10*3/uL — ABNORMAL LOW (ref 150–400)
RBC: 3.97 MIL/uL — ABNORMAL LOW (ref 4.22–5.81)
RDW: 14.7 % (ref 11.5–15.5)
WBC: 21.5 10*3/uL — ABNORMAL HIGH (ref 4.0–10.5)
nRBC: 0 % (ref 0.0–0.2)

## 2018-12-15 LAB — COMPREHENSIVE METABOLIC PANEL
ALT: 32 U/L (ref 0–44)
Albumin: 3.1 g/dL — ABNORMAL LOW (ref 3.5–5.0)
Alkaline Phosphatase: 164 U/L — ABNORMAL HIGH (ref 38–126)
Anion gap: 6 (ref 5–15)
CO2: 21 mmol/L — ABNORMAL LOW (ref 22–32)
Calcium: 8.8 mg/dL — ABNORMAL LOW (ref 8.9–10.3)
Chloride: 113 mmol/L — ABNORMAL HIGH (ref 98–111)
Creatinine, Ser: 1.02 mg/dL (ref 0.61–1.24)
GFR calc Af Amer: >60 mL/min (ref >60)
GFR calc non Af Amer: >60 mL/min (ref >60)
Glucose, Bld: 107 mg/dL — ABNORMAL HIGH (ref 70–99)
Potassium: 4.1 mmol/L (ref 3.5–5.1)
Sodium: 140 mmol/L (ref 135–145)
Total Bilirubin: 0.6 mg/dL (ref 0.3–1.2)
Total Protein: 5.6 g/dL — ABNORMAL LOW (ref 6.5–8.1)

## 2018-12-15 LAB — MAGNESIUM: Magnesium: 2 mg/dL (ref 1.7–2.4)

## 2018-12-15 LAB — COMPREHENSIVE METABOLIC PANEL WITH GFR
AST: 35 U/L (ref 15–41)
BUN: 17 mg/dL (ref 8–23)

## 2018-12-15 NOTE — Progress Notes (Signed)
PROGRESS NOTE    Pedro Palmer  YTK:[id number] DOB: 1933/02/08 DOA: 12/13/2018 PCP: Seward Carol, MD   Brief Narrative:  85 year old with past medical history relevant for colon cancer status post hemicolectomy, remote history of pulmonary embolism not on anticoagulation, prostate cancer, hypertension, hyperlipidemia, Paget's disease of bone admitted with sepsis from urinary source, altered mental status.   Assessment & Plan:   Active Problems:   Sepsis ([place])   #) Altered mental status/sepsis from urinary source: Altered mental status is resolved.  Patient appears to be clinically improving. -CT scan shows only distended bladder with diverticulum - Continue IV cefepime started 12/13/2018 -  blood cultures ordered 12/13/2018 no growth to date - urine cultures ordered 12/13/2018 no growth to date  #) Urinary retention: Patient noted to have distended bladder on CT imaging and to have elevated postvoid residuals. -Every 4 hours bladder scans -In and out cath, will consider placing Foley if patient is persistently retaining  #) AKI: Resolved -Hold nephrotoxins  #) Hypertension/hyperlipidemia: -Hold home lisinopril 20 mg nightly - Continue aspirin 81 mg daily -Continue simvastatin 20 mg every morning  #) Remote history of PE: Currently patient is off all anticoagulation  #) Remote history of colon cancer status post hemicolectomy: Not an active problem  #) Prostate cancer: Not currently an active issue  #) Paget's disease of bone: Alk phos mildly elevated  Fluids: Discontinue IV fluids Electrolytes: Monitor and supplement Nutrition: Heart healthy diet   Prophylaxis: Enoxaparin  Disposition: Pending resolution of sepsis  Full code    Consultants:   None  Procedures:   None  Antimicrobials:   IV cefepime started 12/13/2018   Subjective: This morning patient reports that he is feeling better.  He denies any nausea, vomiting, diarrhea, cough, congestion,  rhinorrhea.  Objective: Vitals:   12/15/18 0009 12/15/18 0400 12/15/18 0800 12/15/18 0805  BP:  139/67 (!) 184/102 (!) 147/74  Pulse:  89 (!) 103 (!) 101  Resp:  (!) 23 (!) 25 (!) 21  Temp: 99.4 F (37.4 C) 99.5 F (37.5 C) 99.6 F (37.6 C)   TempSrc: Oral Oral Oral   SpO2:  96% 97% 96%  Weight:      Height:        Intake/Output Summary (Last 24 hours) at 12/15/2018 0840 Last data filed at 12/15/2018 5573 Gross per 24 hour  Intake 2462.86 ml  Output 2100 ml  Net 362.86 ml   Filed Weights   12/13/18 2107  Weight: 61.2 kg    Examination:  General exam: No acute distress Respiratory system: Clear to auscultation. Respiratory effort normal. Cardiovascular system: Tachycardic, regular rate and rhythm, no murmurs Gastrointestinal system: Soft, nondistended, nontender, no rebound or guarding, plus bowel sounds Central nervous system: Alert but oriented to self, situation, place but not to time, grossly intact, moving all extremities Extremities: No lower extremity edema Skin: No rashes over visible skin Psychiatry: Unable to assess due to medical condition.     Data Reviewed: I have personally reviewed following labs and imaging studies  CBC: Recent Labs  Lab 12/13/18 2125 12/13/18 2306 12/14/18 0333 12/15/18 0315  WBC 9.3  --  18.8* 21.5*  NEUTROABS 8.9*  --  17.5*  --   HGB 16.1 15.3 13.2 13.0  HCT 50.1 45.0 41.2 40.1  MCV 101.6*  --  103.3* 101.0*  PLT 119*  --  121* 220*   Basic Metabolic Panel: Recent Labs  Lab 12/13/18 2125 12/13/18 2306 12/14/18 0333 12/15/18 0315  NA 139 141 142  140  K 3.6 3.3* 3.6 4.1  CL 106 109 111 113*  CO2 24  --  22 21*  GLUCOSE 162* 182* 178* 107*  BUN 23 23 27* 17  CREATININE 1.36* 1.20 1.45* 1.02  CALCIUM 9.7  --  8.7* 8.8*  MG  --   --  1.6* 2.0   GFR: Estimated Creatinine Clearance: 47.5 mL/min (by C-G formula based on SCr of 1.02 mg/dL). Liver Function Tests: Recent Labs  Lab 12/13/18 2125 12/15/18 0315  AST  47* 35  ALT 38 32  ALKPHOS 253* 164*  BILITOT 1.0 0.6  PROT 6.7 5.6*  ALBUMIN 4.0 3.1*   No results for input(s): LIPASE, AMYLASE in the last 168 hours. No results for input(s): AMMONIA in the last 168 hours. Coagulation Profile: No results for input(s): INR, PROTIME in the last 168 hours. Cardiac Enzymes: No results for input(s): CKTOTAL, CKMB, CKMBINDEX, TROPONINI in the last 168 hours. BNP (last 3 results) No results for input(s): PROBNP in the last 8760 hours. HbA1C: No results for input(s): HGBA1C in the last 72 hours. CBG: No results for input(s): GLUCAP in the last 168 hours. Lipid Profile: No results for input(s): CHOL, HDL, LDLCALC, TRIG, CHOLHDL, LDLDIRECT in the last 72 hours. Thyroid Function Tests: No results for input(s): TSH, T4TOTAL, FREET4, T3FREE, THYROIDAB in the last 72 hours. Anemia Panel: No results for input(s): VITAMINB12, FOLATE, FERRITIN, TIBC, IRON, RETICCTPCT in the last 72 hours. Sepsis Labs: Recent Labs  Lab 12/13/18 2204 12/13/18 2305  LATICACIDVEN 2.35* 1.90    Recent Results (from the past 240 hour(s))  Blood Culture (routine x 2)     Status: None (Preliminary result)   Collection Time: 12/13/18  9:25 PM  Result Value Ref Range Status   Specimen Description   Final    BLOOD LEFT ANTECUBITAL Performed at [facility] [address], [place], [place] [address]    Special Requests   Final    BOTTLES DRAWN AEROBIC AND ANAEROBIC Blood Culture adequate volume Performed at [facility] [address], [place], [place] [address]    Culture   Final    NO GROWTH 1 DAY Performed at [facility], [place] [address] [place], [place] [address]    Report Status PENDING  Incomplete  Urine culture     Status: None (Preliminary result)   Collection Time: 12/13/18  9:25 PM  Result Value Ref Range Status   Specimen Description   Final    URINE, CLEAN CATCH Performed at [facility], [place] [address], [place], [place] [address]    Special Requests   Final    NONE Performed at [facility], [place] [address] [place], [place] [address]    Culture   Final    CULTURE REINCUBATED FOR BETTER GROWTH Performed at [facility], [place] [address] [place], [place] [address]    Report Status PENDING  Incomplete  Blood Culture (routine x 2)     Status: None (Preliminary result)   Collection Time: 12/13/18  9:30 PM  Result Value Ref Range Status   Specimen Description   Final    BLOOD LEFT ANTECUBITAL Performed at [facility] [address] [place], [place] [address]    Special Requests   Final    BOTTLES DRAWN AEROBIC AND ANAEROBIC Blood Culture adequate volume Performed at [facility] [address] [place], [place] [address]    Culture  Final    NO GROWTH 1 DAY Performed at [facility], [place] [address] [place], [place] [address]    Report Status PENDING  Incomplete  MRSA PCR Screening     Status: None   Collection Time: 12/14/18 12:47 AM  Result Value Ref Range Status   MRSA by PCR NEGATIVE NEGATIVE Final    Comment:        The GeneXpert MRSA Assay (FDA approved for NASAL specimens only), is one component of a comprehensive MRSA colonization surveillance program. It is not intended to diagnose MRSA infection nor to guide or monitor treatment for MRSA infections. Performed at [facility], [place] [address], [place], [place] [address]          Radiology Studies: Ct Abdomen Pelvis Wo Contrast  Result Date: 12/14/2018 CLINICAL DATA:  Distended abdomen. History of colon cancer, prostate cancer EXAM: CT ABDOMEN AND PELVIS WITHOUT CONTRAST TECHNIQUE: Multidetector CT imaging of the abdomen and pelvis was performed following the standard protocol without IV contrast. COMPARISON:  CT abdomen 01/18/2015 FINDINGS: Lower chest: Mild bibasilar atelectasis. Negative for pneumonia or  effusion. Heart size within normal limits. Hepatobiliary: No focal liver abnormality is seen. No gallstones, gallbladder wall thickening, or biliary dilatation. Pancreas: Negative Spleen: Negative Adrenals/Urinary Tract: 10 mm left upper pole renal cyst has enlarged. 3.3 mm right upper pole renal cyst has enlarged. Small complex cyst right lower pole unchanged in size. No renal stone or obstruction. Distended urinary bladder with diffuse wall thickening of the wall. 5 cm diverticulum of the bladder dome on the right. Stomach/Bowel: Negative for bowel obstruction. Enteric contrast fills bowel extending to the rectum. Appendectomy. Small hiatal hernia. Vascular/Lymphatic: Minimal atherosclerotic disease.  No adenopathy Reproductive: Moderate prostate enlargement. Periumbilical hernia on the prior study no longer visualized apparently has been repaired. Other: No free fluid Musculoskeletal: Irregular sclerotic changes in the sacrum. Given history of Paget's disease and stability, this is likely benign. Patchy lytic and sclerotic lesions in the lumbar spine also likely Paget's disease. Negative for fracture. Moderate disc degeneration and spurring L3-4 L4-5 IMPRESSION: 1. Negative for bowel obstruction 2. Distended bladder with bladder wall thickening and bladder diverticulum. Prostate enlargement. 3. Bony changes in the lumbar and sacral spine are stable and likely benign, probable Paget's disease. Electronically Signed   By: Franchot Gallo M.D.   On: 12/14/2018 15:15   Dg Chest Port 1 View  Result Date: 12/13/2018 CLINICAL DATA:  Cough, fever and weakness EXAM: PORTABLE CHEST 1 VIEW COMPARISON:  CT chest 01/18/2015, CXR 01/05/2014 FINDINGS: AP portable semi upright view of the chest. Low lung volumes are noted. Borderline cardiomegaly with tortuous thoracic aorta. Mild central vascular congestion without acute pulmonary consolidation, effusion or pneumothorax. No acute nor aggressive osseous abnormality.  IMPRESSION: Low lung volumes with borderline cardiomegaly and mild central vascular congestion. Electronically Signed   By: Ashley Royalty M.D.   On: 12/13/2018 22:25        Scheduled Meds: . aspirin EC  81 mg Oral Daily  . enoxaparin (LOVENOX) injection  40 mg Subcutaneous Q24H  . simvastatin  20 mg Oral QPC breakfast   Continuous Infusions: . ceFEPime (MAXIPIME) IV Stopped (12/14/18 2255)     LOS: 2 days    Time spent: [place], MD [facility]  If 7PM-7AM, please contact night-coverage [email or web address] Password [email or web address] 12/15/2018, 8:40 AM

## 2018-12-15 NOTE — Plan of Care (Signed)
  Problem: Respiratory: Goal: Ability to maintain adequate ventilation will improve Outcome: Progressing   Problem: Safety: Goal: Ability to remain free from injury will improve Outcome: Progressing   Problem: Pain Managment: Goal: General experience of comfort will improve Outcome: Progressing

## 2018-12-16 DIAGNOSIS — N390 Urinary tract infection, site not specified: Secondary | ICD-10-CM

## 2018-12-16 LAB — BASIC METABOLIC PANEL
Anion gap: 7 (ref 5–15)
CO2: 21 mmol/L — ABNORMAL LOW (ref 22–32)
Calcium: 9.1 mg/dL (ref 8.9–10.3)
Chloride: 113 mmol/L — ABNORMAL HIGH (ref 98–111)
Creatinine, Ser: 0.86 mg/dL (ref 0.61–1.24)
GFR calc Af Amer: >60 mL/min (ref >60)
GFR calc non Af Amer: >60 mL/min (ref >60)
Glucose, Bld: 132 mg/dL — ABNORMAL HIGH (ref 70–99)
Sodium: 141 mmol/L (ref 135–145)

## 2018-12-16 LAB — BLOOD CULTURE ID PANEL (REFLEXED)
Acinetobacter baumannii: NOT DETECTED
Candida albicans: NOT DETECTED
Candida glabrata: NOT DETECTED
Candida krusei: NOT DETECTED
Candida parapsilosis: NOT DETECTED
Candida tropicalis: NOT DETECTED
Enterobacter cloacae complex: NOT DETECTED
Enterobacteriaceae species: NOT DETECTED
Enterococcus species: NOT DETECTED
Escherichia coli: NOT DETECTED
Haemophilus influenzae: NOT DETECTED
Klebsiella oxytoca: NOT DETECTED
Klebsiella pneumoniae: NOT DETECTED
Listeria monocytogenes: NOT DETECTED
Methicillin resistance: NOT DETECTED
Neisseria meningitidis: NOT DETECTED
PROTEUS SPECIES: NOT DETECTED
Pseudomonas aeruginosa: NOT DETECTED
STAPHYLOCOCCUS SPECIES: DETECTED — AB
Serratia marcescens: NOT DETECTED
Staphylococcus aureus (BCID): NOT DETECTED
Streptococcus agalactiae: NOT DETECTED
Streptococcus pneumoniae: NOT DETECTED
Streptococcus pyogenes: NOT DETECTED
Streptococcus species: NOT DETECTED

## 2018-12-16 LAB — CBC
HCT: 41.2 % (ref 39.0–52.0)
Hemoglobin: 13.4 g/dL (ref 13.0–17.0)
MCH: 32.4 pg (ref 26.0–34.0)
MCHC: 32.5 g/dL (ref 30.0–36.0)
MCV: 99.8 fL (ref 80.0–100.0)
Platelets: 100 K/uL — ABNORMAL LOW (ref 150–400)
RBC: 4.13 MIL/uL — ABNORMAL LOW (ref 4.22–5.81)
RDW: 14.6 % (ref 11.5–15.5)
WBC: 16.3 10*3/uL — ABNORMAL HIGH (ref 4.0–10.5)
nRBC: 0 % (ref 0.0–0.2)

## 2018-12-16 LAB — URINE CULTURE

## 2018-12-16 LAB — GLUCOSE, CAPILLARY: Glucose-Capillary: 119 mg/dL — ABNORMAL HIGH (ref 70–99)

## 2018-12-16 LAB — BASIC METABOLIC PANEL WITH GFR
BUN: 14 mg/dL (ref 8–23)
Potassium: 3.8 mmol/L (ref 3.5–5.1)

## 2018-12-16 LAB — MAGNESIUM: Magnesium: 1.9 mg/dL (ref 1.7–2.4)

## 2018-12-16 MED ORDER — SACCHAROMYCES BOULARDII 250 MG PO CAPS
250.0000 mg | ORAL_CAPSULE | Freq: Two times a day (BID) | ORAL | Status: DC
  Administered 2018-12-16 – 2018-12-23 (×15): 250 mg via ORAL
  Filled 2018-12-16 (×15): qty 1

## 2018-12-16 MED ORDER — LISINOPRIL 20 MG PO TABS
20.0000 mg | ORAL_TABLET | Freq: Every evening | ORAL | Status: DC
  Administered 2018-12-16 – 2018-12-17 (×2): 20 mg via ORAL
  Filled 2018-12-16 (×2): qty 1

## 2018-12-16 MED ORDER — TAMSULOSIN HCL 0.4 MG PO CAPS
0.4000 mg | ORAL_CAPSULE | Freq: Every day | ORAL | Status: DC
  Administered 2018-12-16 – 2018-12-23 (×8): 0.4 mg via ORAL
  Filled 2018-12-16 (×8): qty 1

## 2018-12-16 MED ORDER — CIPROFLOXACIN HCL 500 MG PO TABS
500.0000 mg | ORAL_TABLET | Freq: Two times a day (BID) | ORAL | Status: AC
  Administered 2018-12-16 – 2018-12-20 (×9): 500 mg via ORAL
  Filled 2018-12-16 (×9): qty 1

## 2018-12-16 NOTE — Care Management Important Message (Signed)
Important Message  Patient Details  Name: Pedro Palmer MRN: [id number] Date of Birth: 1933-03-02   Medicare Important Message Given:  Yes    Kerin Salen 12/16/2018, 11:34 AMImportant Message  Patient Details  Name: Pedro Palmer MRN: [id number] Date of Birth: [birth year]   Medicare Important Message Given:  Yes    Kerin Salen 12/16/2018, 11:34 AM

## 2018-12-16 NOTE — Evaluation (Signed)
Physical Therapy Evaluation Patient Details Name: Pedro Palmer MRN: [id number] DOB: 17-Jul-1933 Today's Date: 12/16/2018   History of Present Illness  85 year old with past medical history relevant for colon cancer status post hemicolectomy, remote history of pulmonary embolism not on anticoagulation, prostate cancer, hypertension, hyperlipidemia, Paget's disease of bone admitted with sepsis from urinary source, altered mental status  Clinical Impression  Pt admitted with above diagnosis. Pt currently with functional limitations due to the deficits listed below (see PT Problem List). Pt will benefit from skilled PT to increase their independence and safety with mobility to allow discharge to the venue listed below.  Pt appears slightly confused at times however pleasant and follows simple commands.  Pt assisted with ambulating however distance limited by dizziness (see below).  No family present.  Recommend SNF at this time unless family feel able to assist pt at home.     Follow Up Recommendations Supervision/Assistance - 24 hour;SNF    Equipment Recommendations  Rolling walker with 5" wheels    Recommendations for Other Services       Precautions / Restrictions Precautions Precautions: Fall      Mobility  Bed Mobility Overal bed mobility: Needs Assistance Bed Mobility: Supine to Sit;Sit to Supine     Supine to sit: Min assist Sit to supine: Min guard   General bed mobility comments: min/guard for safety, assist initially for cues  Transfers Overall transfer level: Needs assistance Equipment used: Rolling walker (2 wheeled) Transfers: Sit to/from Stand Sit to Stand: Min assist         General transfer comment: assist to rise and steady, cues for hand placement  Ambulation/Gait Ambulation/Gait assistance: Min assist Gait Distance (Feet): 80 Feet Assistive device: Rolling walker (2 wheeled) Gait Pattern/deviations: Step-through pattern;Decreased stride length;Trunk  flexed Gait velocity: decr   General Gait Details: verbal cues for RW positioning, posture, pt reports dizziness which did not resolve during ambulation so distance limited, BP upon return to supine: 167/93 mmHg  Stairs            Wheelchair Mobility    Modified Rankin (Stroke Patients Only)       Balance Overall balance assessment: Needs assistance         Standing balance support: Bilateral upper extremity supported Standing balance-Leahy Scale: Poor                               Pertinent Vitals/Pain Pain Assessment: No/denies pain    Home Living Family/patient expects to be discharged to:: Private residence Living Arrangements: Spouse/significant other   Type of Home: [place]: One level Home Equipment: None      Prior Function Level of Independence: Independent         Comments: no family present, pt reports he lives with spouse and does not use assistive device at baseline, however he also reports he still rides a Nurse, adult Dominance        Extremity/Trunk Assessment        Lower Extremity Assessment Lower Extremity Assessment: Generalized weakness       Communication      Cognition Arousal/Alertness: Awake/alert Behavior During Therapy: WFL for tasks assessed/performed Overall Cognitive Status: No family/caregiver present to determine baseline cognitive functioning  General Comments: admitted with AMS, appears slightly confused at times, uncertain of baseline, no family present      General Comments      Exercises     Assessment/Plan    PT Assessment Patient needs continued PT services  PT Problem List Decreased strength;Decreased mobility;Decreased activity tolerance;Decreased balance;Decreased knowledge of use of DME;Decreased safety awareness       PT Treatment Interventions DME instruction;Functional mobility training;Therapeutic  exercise;Therapeutic activities;Gait training;Patient/family education;Balance training    PT Goals (Current goals can be found in the Care Plan section)  Acute Rehab PT Goals PT Goal Formulation: With patient Time For Goal Achievement: 12/30/18 Potential to Achieve Goals: Good    Frequency Min 3X/week   Barriers to discharge        Co-evaluation               AM-PAC PT "6 Clicks" Mobility  Outcome Measure Help needed turning from your back to your side while in a flat bed without using bedrails?: A Little Help needed moving from lying on your back to sitting on the side of a flat bed without using bedrails?: A Little Help needed moving to and from a bed to a chair (including a wheelchair)?: A Little Help needed standing up from a chair using your arms (e.g., wheelchair or bedside chair)?: A Little Help needed to walk in hospital room?: A Little Help needed climbing 3-5 steps with a railing? : A Lot 6 Click Score: 17    End of Session Equipment Utilized During Treatment: Gait belt Activity Tolerance: Patient tolerated treatment well Patient left: in bed;with call bell/phone within reach;with bed alarm set Nurse Communication: Mobility status PT Visit Diagnosis: Other abnormalities of gait and mobility (R26.89)    Time: 6659-9357 PT Time Calculation (min) (ACUTE ONLY): 24 min   Charges:   PT Evaluation $PT Eval Low Complexity: [place], PT, DPT Acute Rehabilitation Services Office: [phone] Pager: [phone]  Trena Platt 12/16/2018, 12:54 PM

## 2018-12-16 NOTE — Progress Notes (Signed)
PROGRESS NOTE    Pedro Palmer  QBH:[id number] DOB: 1933-05-04 DOA: 12/13/2018 PCP: Seward Carol, MD   Brief Narrative:  85 year old with past medical history relevant for colon cancer status post hemicolectomy, remote history of pulmonary embolism not on anticoagulation, prostate cancer, hypertension, hyperlipidemia, Paget's disease of bone admitted with sepsis from urinary source, altered mental status.   Assessment & Plan:   Acute toxic encephalopathy  Secondary to urinary tract infection.  Now improved   Urinary tract infection with sepsis CT scan only showed distended bladder with diverticulum.  Patient was started on cefepime.  Blood cultures grew coag negative staph in 1 out of 2.  This is likely a contaminant.  Urine culture with growth of multiple species.  No previous culture data available.  Care everywhere also reviewed.  Change to ciprofloxacin.    Coag negative staph bacteremia Only in 1 out of 2 sets.  Most likely contaminant.  Urinary retention Patient noted to have distended bladder on CT imaging and to have elevated postvoid residuals.  Initiate Flomax.  Patient has required in and out catheterizations.  He will need to see urology in the outpatient setting.  If he is unable to urinate may have to place an indwelling Foley catheter.  AKI Resolved -Hold nephrotoxins  Essential hypertension and dyslipidemia Pressure noted to be poorly controlled.  His lisinopril was on hold due to renal failure.  Since renal function has improved this can be resumed.  Continue statin.  Continue aspirin.    Remote history of PE Currently patient is off all anticoagulation  Remote history of colon cancer status post hemicolectomy Not an active problem  Prostate cancer See above.  Will need to follow-up with urology.  Paget's disease of bone Alk phos mildly elevated  Prophylaxis: Enoxaparin  Disposition: PT has recommended SNF versus 24-hour supervision at home.  Full  code    Consultants:   None  Procedures:   None  Antimicrobials:   IV cefepime started 12/13/2018   Subjective: Patient states that he is feeling better.  Denies any nausea vomiting.  No abdominal pain.  Objective: Vitals:   12/16/18 0429 12/16/18 0632 12/16/18 1036 12/16/18 1401  BP: (!) 170/94 (!) 151/84 (!) 167/93 (!) 158/86  Pulse: 93 77 87 82  Resp:  16  16  Temp: 98.3 F (36.8 C)   98.9 F (37.2 C)  TempSrc: Oral   Oral  SpO2: 93%   97%  Weight:      Height:        Intake/Output Summary (Last 24 hours) at 12/16/2018 1529 Last data filed at 12/16/2018 1449 Gross per 24 hour  Intake 460 ml  Output 2175 ml  Net -1715 ml   Filed Weights   12/13/18 2107  Weight: 61.2 kg    Examination:  General appearance: Awake alert.  In no distress Resp: Clear to auscultation bilaterally.  Normal effort Cardio: S1-S2 is normal regular.  No S3-S4.  No rubs murmurs or bruit GI: Abdomen is soft.  Nontender nondistended.  Bowel sounds are present normal.  No masses organomegaly Extremities: No edema.  Full range of motion of lower extremities. Neurologic: Alert and oriented x3.  No focal neurological deficits.      Data Reviewed: I have personally reviewed following labs and imaging studies  CBC: Recent Labs  Lab 12/13/18 2125 12/13/18 2306 12/14/18 0333 12/15/18 0315 12/16/18 0356  WBC 9.3  --  18.8* 21.5* 16.3*  NEUTROABS 8.9*  --  17.5*  --   --  HGB 16.1 15.3 13.2 13.0 13.4  HCT 50.1 45.0 41.2 40.1 41.2  MCV 101.6*  --  103.3* 101.0* 99.8  PLT 119*  --  121* 101* 563*   Basic Metabolic Panel: Recent Labs  Lab 12/13/18 2125 12/13/18 2306 12/14/18 0333 12/15/18 0315 12/16/18 0356  NA 139 141 142 140 141  K 3.6 3.3* 3.6 4.1 3.8  CL 106 109 111 113* 113*  CO2 24  --  22 21* 21*  GLUCOSE 162* 182* 178* 107* 132*  BUN 23 23 27* 17 14  CREATININE 1.36* 1.20 1.45* 1.02 0.86  CALCIUM 9.7  --  8.7* 8.8* 9.1  MG  --   --  1.6* 2.0 1.9   GFR: Estimated  Creatinine Clearance: 56.3 mL/min (by C-G formula based on SCr of 0.86 mg/dL).  Liver Function Tests: Recent Labs  Lab 12/13/18 2125 12/15/18 0315  AST 47* 35  ALT 38 32  ALKPHOS 253* 164*  BILITOT 1.0 0.6  PROT 6.7 5.6*  ALBUMIN 4.0 3.1*   CBG: Recent Labs  Lab 12/16/18 0733  GLUCAP 119*   Sepsis Labs: Recent Labs  Lab 12/13/18 2204 12/13/18 2305  LATICACIDVEN 2.35* 1.90    Recent Results (from the past 240 hour(s))  Blood Culture (routine x 2)     Status: Abnormal (Preliminary result)   Collection Time: 12/13/18  9:25 PM  Result Value Ref Range Status   Specimen Description   Final    BLOOD LEFT ANTECUBITAL Performed at [facility], [place] [address] [place], [place] [address]    Special Requests   Final    BOTTLES DRAWN AEROBIC AND ANAEROBIC Blood Culture adequate volume Performed at [place] [address] [place], [place] [address]    Culture  Setup Time   Final    AEROBIC BOTTLE ONLY GRAM POSITIVE COCCI Organism ID to follow CRITICAL RESULT CALLED TO, READ BACK BY AND VERIFIED WITH: A PHAN PHARMD [id number] 12/16/18 A BROWNING    Culture (A)  Final    STAPHYLOCOCCUS SPECIES (COAGULASE NEGATIVE) THE SIGNIFICANCE OF ISOLATING THIS ORGANISM FROM A SINGLE SET OF BLOOD CULTURES WHEN MULTIPLE SETS ARE DRAWN IS UNCERTAIN. PLEASE NOTIFY THE MICROBIOLOGY DEPARTMENT WITHIN ONE WEEK IF SPECIATION AND SENSITIVITIES ARE REQUIRED. Performed at [facility], [place] [address] [place], [place] [address]    Report Status PENDING  Incomplete  Urine culture     Status: Abnormal   Collection Time: 12/13/18  9:25 PM  Result Value Ref Range Status   Specimen Description   Final    URINE, CLEAN CATCH Performed at [facility], [place] [address] [place], [place] [address]    Special Requests   Final    NONE Performed at [facility], [place] [address] [place], [place] [address]    Culture MULTIPLE  SPECIES PRESENT, SUGGEST RECOLLECTION (A)  Final   Report Status 12/16/2018 FINAL  Final  Blood Culture ID Panel (Reflexed)     Status: Abnormal   Collection Time: 12/13/18  9:25 PM  Result Value Ref Range Status   Enterococcus species NOT DETECTED NOT DETECTED Final   Listeria monocytogenes NOT DETECTED NOT DETECTED Final   Staphylococcus species DETECTED (A) NOT DETECTED Final    Comment: Methicillin (oxacillin) susceptible coagulase negative staphylococcus. Possible blood culture contaminant (unless isolated from more than one blood culture draw or clinical case suggests pathogenicity). No antibiotic treatment is indicated for blood  culture contaminants. CRITICAL RESULT CALLED TO, READ  BACK BY AND VERIFIED WITH: A PHAN PHARMD [id number] 12/16/18 A BROWNING    Staphylococcus aureus (BCID) NOT DETECTED NOT DETECTED Final   Methicillin resistance NOT DETECTED NOT DETECTED Final   Streptococcus species NOT DETECTED NOT DETECTED Final   Streptococcus agalactiae NOT DETECTED NOT DETECTED Final   Streptococcus pneumoniae NOT DETECTED NOT DETECTED Final   Streptococcus pyogenes NOT DETECTED NOT DETECTED Final   Acinetobacter baumannii NOT DETECTED NOT DETECTED Final   Enterobacteriaceae species NOT DETECTED NOT DETECTED Final   Enterobacter cloacae complex NOT DETECTED NOT DETECTED Final   Escherichia coli NOT DETECTED NOT DETECTED Final   Klebsiella oxytoca NOT DETECTED NOT DETECTED Final   Klebsiella pneumoniae NOT DETECTED NOT DETECTED Final   Proteus species NOT DETECTED NOT DETECTED Final   Serratia marcescens NOT DETECTED NOT DETECTED Final   Haemophilus influenzae NOT DETECTED NOT DETECTED Final   Neisseria meningitidis NOT DETECTED NOT DETECTED Final   Pseudomonas aeruginosa NOT DETECTED NOT DETECTED Final   Candida albicans NOT DETECTED NOT DETECTED Final   Candida glabrata NOT DETECTED NOT DETECTED Final   Candida krusei NOT DETECTED NOT DETECTED Final   Candida parapsilosis NOT  DETECTED NOT DETECTED Final   Candida tropicalis NOT DETECTED NOT DETECTED Final    Comment: Performed at [facility], [place]. [address] [place], [place] [address]  Blood Culture (routine x 2)     Status: None (Preliminary result)   Collection Time: 12/13/18  9:30 PM  Result Value Ref Range Status   Specimen Description   Final    BLOOD LEFT ANTECUBITAL Performed at [place] [address], [place], [place] [address]    Special Requests   Final    BOTTLES DRAWN AEROBIC AND ANAEROBIC Blood Culture adequate volume Performed at [place] [address], [place], [place] [address]    Culture   Final    NO GROWTH 2 DAYS Performed at [facility] [address] [place], [place] [address]    Report Status PENDING  Incomplete  MRSA PCR Screening     Status: None   Collection Time: 12/14/18 12:47 AM  Result Value Ref Range Status   MRSA by PCR NEGATIVE NEGATIVE Final    Comment:        The GeneXpert MRSA Assay (FDA approved for NASAL specimens only), is one component of a comprehensive MRSA colonization surveillance program. It is not intended to diagnose MRSA infection nor to guide or monitor treatment for MRSA infections. Performed at [facility], [place] [address], [place], [place] [address]          Radiology Studies: No results found.      Scheduled Meds: . aspirin EC  81 mg Oral Daily  . enoxaparin (LOVENOX) injection  40 mg Subcutaneous Q24H  . simvastatin  20 mg Oral QPC breakfast  . tamsulosin  0.4 mg Oral Daily   Continuous Infusions: . ceFEPime (MAXIPIME) IV 1 g (12/16/18 1000)     LOS: 3 days    Bonnielee Haff, MD [facility]  If 7PM-7AM, please contact night-coverage [email or web address] Password [facility] 12/16/2018, 3:29 PM

## 2018-12-16 NOTE — Progress Notes (Signed)
Patient has been able to void throughout the shift but when scanned at 4am the volume showed 414mL. Lamar Blinks NP made aware. Order received for I&O cath and followed out. 657mL of cloudy yellow urine returned. Patient has no complaints at this time. Will continue plan of care.

## 2018-12-16 NOTE — Progress Notes (Signed)
PHARMACY NOTE -  Ciprofloxacin  Pharmacy has been assisting with dosing of Cipro for urosepsis.  Dosage remains stable at 500 mg PO q12 hr and need for further dosage adjustment appears unlikely at present given stable renal function  Pharmacy will sign off, following peripherally for culture results or dose adjustments. Please reconsult if a change in clinical status warrants re-evaluation of dosage.  Reuel Boom, PharmD, BCPS [phone] 12/16/2018, 4:55 PM

## 2018-12-16 NOTE — Progress Notes (Signed)
1200 bladder scan showed 320cc. MD made aware, no I&O cath at this time. Condom cath removed, pt encouraged to stand up with assistance when urinating. Phi Avans, Bing Neighbors, RN

## 2018-12-16 NOTE — Progress Notes (Signed)
PHARMACY - PHYSICIAN COMMUNICATION CRITICAL VALUE ALERT - BLOOD CULTURE IDENTIFICATION (BCID)  Pedro Palmer is an 85 y.o. male who presented to [facility] on 12/13/2018 with a chief complaint of AMS, weakness and fever.  He was started on cefepime for suspected sepsis secondary to UTI. Now 1/4 bcx bottles with CoNS.  Name of physician (or Provider) Contacted: Raliegh Ip Schorr  Current antibiotics: cefepime  Changes to prescribed antibiotics recommended:  - suspects contaminant, continue with current abx of cefepime  Results for orders placed or performed during the hospital encounter of 12/13/18  Blood Culture ID Panel (Reflexed) (Collected: 12/13/2018  9:25 PM)  Result Value Ref Range   Enterococcus species NOT DETECTED NOT DETECTED   Listeria monocytogenes NOT DETECTED NOT DETECTED   Staphylococcus species DETECTED (A) NOT DETECTED   Staphylococcus aureus (BCID) NOT DETECTED NOT DETECTED   Methicillin resistance NOT DETECTED NOT DETECTED   Streptococcus species NOT DETECTED NOT DETECTED   Streptococcus agalactiae NOT DETECTED NOT DETECTED   Streptococcus pneumoniae NOT DETECTED NOT DETECTED   Streptococcus pyogenes NOT DETECTED NOT DETECTED   Acinetobacter baumannii NOT DETECTED NOT DETECTED   Enterobacteriaceae species NOT DETECTED NOT DETECTED   Enterobacter cloacae complex NOT DETECTED NOT DETECTED   Escherichia coli NOT DETECTED NOT DETECTED   Klebsiella oxytoca NOT DETECTED NOT DETECTED   Klebsiella pneumoniae NOT DETECTED NOT DETECTED   Proteus species NOT DETECTED NOT DETECTED   Serratia marcescens NOT DETECTED NOT DETECTED   Haemophilus influenzae NOT DETECTED NOT DETECTED   Neisseria meningitidis NOT DETECTED NOT DETECTED   Pseudomonas aeruginosa NOT DETECTED NOT DETECTED   Candida albicans NOT DETECTED NOT DETECTED   Candida glabrata NOT DETECTED NOT DETECTED   Candida krusei NOT DETECTED NOT DETECTED   Candida parapsilosis NOT DETECTED NOT DETECTED   Candida tropicalis NOT  DETECTED NOT DETECTED    Lynelle Doctor 12/16/2018  12:34 AM

## 2018-12-17 LAB — CULTURE, BLOOD (ROUTINE X 2): Special Requests: ADEQUATE

## 2018-12-17 LAB — BASIC METABOLIC PANEL
Anion gap: 8 (ref 5–15)
BUN: 9 mg/dL (ref 8–23)
CHLORIDE: 112 mmol/L — AB (ref 98–111)
CO2: 21 mmol/L — AB (ref 22–32)
Calcium: 9 mg/dL (ref 8.9–10.3)
Creatinine, Ser: 0.83 mg/dL (ref 0.61–1.24)
GFR calc Af Amer: >60 mL/min (ref >60)
GFR calc non Af Amer: >60 mL/min (ref >60)
Glucose, Bld: 118 mg/dL — ABNORMAL HIGH (ref 70–99)
Potassium: 3.7 mmol/L (ref 3.5–5.1)
Sodium: 141 mmol/L (ref 135–145)

## 2018-12-17 LAB — CBC
HCT: 42.4 % (ref 39.0–52.0)
HEMOGLOBIN: 14 g/dL (ref 13.0–17.0)
MCH: 32.7 pg (ref 26.0–34.0)
MCHC: 33 g/dL (ref 30.0–36.0)
MCV: 99.1 fL (ref 80.0–100.0)
Platelets: 119 10*3/uL — ABNORMAL LOW (ref 150–400)
RBC: 4.28 MIL/uL (ref 4.22–5.81)
RDW: 14.2 % (ref 11.5–15.5)
WBC: 8.1 10*3/uL (ref 4.0–10.5)
nRBC: 0 % (ref 0.0–0.2)

## 2018-12-17 LAB — MAGNESIUM: Magnesium: 1.9 mg/dL (ref 1.7–2.4)

## 2018-12-17 MED ORDER — HYDRALAZINE HCL 20 MG/ML IJ SOLN
10.0000 mg | Freq: Four times a day (QID) | INTRAMUSCULAR | Status: DC | PRN
  Administered 2018-12-17 – 2018-12-18 (×2): 10 mg via INTRAVENOUS
  Filled 2018-12-17 (×2): qty 1

## 2018-12-17 MED ORDER — INFLUENZA VAC SPLIT HIGH-DOSE 0.5 ML IM SUSY
0.5000 mL | PREFILLED_SYRINGE | INTRAMUSCULAR | Status: DC
  Filled 2018-12-17: qty 0.5

## 2018-12-17 NOTE — Progress Notes (Signed)
PROGRESS NOTE    Pedro Palmer  VFI:[id number] DOB: 1933-05-14 DOA: 12/13/2018 PCP: Seward Carol, MD   Brief Narrative:  85 year old with past medical history relevant for colon cancer status post hemicolectomy, remote history of pulmonary embolism not on anticoagulation, prostate cancer, hypertension, hyperlipidemia, Paget's disease of bone admitted with sepsis from urinary source, altered mental status.  Patient was hospitalized.  Started on IV antibiotics.   Assessment & Plan:   Acute toxic encephalopathy  Secondary to urinary tract infection.  Now improved   Urinary tract infection with sepsis CT scan only showed distended bladder with diverticulum.  Patient was started on cefepime.  Blood cultures grew coag negative staph in 1 out of 2.  This is likely a contaminant.  Urine culture with growth of multiple species.  No previous culture data available.  Care everywhere also reviewed.  Patient was changed over to ciprofloxacin which he appears to be tolerating well..  Renal function is normal.  Coag negative staph bacteremia Only in 1 out of 2 sets.  Most likely contaminant.  Urinary retention Patient noted to have distended bladder on CT imaging and to have elevated postvoid residuals.  Flomax was initiated.  He has been able to void on own but he has to be standing up to do so.  Continue to do the same and avoid an indwelling catheter as much as possible.   AKI Resolved  Essential hypertension and dyslipidemia Blood pressure remains elevated.  Lisinopril was resumed.  Continue for now.  Continue to monitor.  Continue statin.    Remote history of PE Currently patient is off all anticoagulation  Remote history of colon cancer status post hemicolectomy Not an active problem  Prostate cancer See above.  Will need to follow-up with urology.  Paget's disease of bone Alk phos mildly elevated  Prophylaxis: Enoxaparin  Disposition: Skilled nursing facility recommended by  physical therapy for short-term rehab.  Discussed with his wife today who has had multiple surgeries recently and would also like for the patient to get some rehab.  Social worker consulted.  Full code    Consultants:   None  Procedures:   None  Antimicrobials:   IV cefepime started 12/13/2018  Changed over to ciprofloxacin on 1/8   Subjective: Patient states that he is feeling well.  He is okay with going to skilled nursing facility if that is what his wife wants.  Denies any abdominal pain nausea or vomiting.  Objective: Vitals:   12/16/18 1036 12/16/18 1401 12/16/18 2046 12/17/18 0419  BP: (!) 167/93 (!) 158/86 (!) 158/91 (!) 165/90  Pulse: 87 82 84 80  Resp:  '16 15 18  '$ Temp:  98.9 F (37.2 C) 98.8 F (37.1 C) 98.3 F (36.8 C)  TempSrc:  Oral Oral Oral  SpO2:  97% 94% 94%  Weight:      Height:        Intake/Output Summary (Last 24 hours) at 12/17/2018 1223 Last data filed at 12/17/2018 4166 Gross per 24 hour  Intake 240 ml  Output 1925 ml  Net -1685 ml   Filed Weights   12/13/18 2107  Weight: 61.2 kg    Examination:  General appearance: Awake alert.  In no distress Resp: Clear to auscultation bilaterally.  Normal effort Cardio: S1-S2 is normal regular.  No S3-S4.  No rubs murmurs or bruit GI: Abdomen is soft.  Nontender nondistended.  Bowel sounds are present normal.  No masses organomegaly Extremities: No edema.  Full range of motion of lower  extremities. Neurologic: Alert and oriented x3.  No focal neurological deficits.     Data Reviewed: I have personally reviewed following labs and imaging studies  CBC: Recent Labs  Lab 12/13/18 2125 12/13/18 2306 12/14/18 0333 12/15/18 0315 12/16/18 0356 12/17/18 0421  WBC 9.3  --  18.8* 21.5* 16.3* 8.1  NEUTROABS 8.9*  --  17.5*  --   --   --   HGB 16.1 15.3 13.2 13.0 13.4 14.0  HCT 50.1 45.0 41.2 40.1 41.2 42.4  MCV 101.6*  --  103.3* 101.0* 99.8 99.1  PLT 119*  --  121* 101* 100* 119*   Basic  Metabolic Panel: Recent Labs  Lab 12/13/18 2125 12/13/18 2306 12/14/18 0333 12/15/18 0315 12/16/18 0356 12/17/18 0421  NA 139 141 142 140 141 141  K 3.6 3.3* 3.6 4.1 3.8 3.7  CL 106 109 111 113* 113* 112*  CO2 24  --  22 21* 21* 21*  GLUCOSE 162* 182* 178* 107* 132* 118*  BUN 23 23 27* '17 14 9  '$ CREATININE 1.36* 1.20 1.45* 1.02 0.86 0.83  CALCIUM 9.7  --  8.7* 8.8* 9.1 9.0  MG  --   --  1.6* 2.0 1.9 1.9   GFR: Estimated Creatinine Clearance: 58.4 mL/min (by C-G formula based on SCr of 0.83 mg/dL).  Liver Function Tests: Recent Labs  Lab 12/13/18 2125 12/15/18 0315  AST 47* 35  ALT 38 32  ALKPHOS 253* 164*  BILITOT 1.0 0.6  PROT 6.7 5.6*  ALBUMIN 4.0 3.1*   CBG: Recent Labs  Lab 12/16/18 0733  GLUCAP 119*   Sepsis Labs: Recent Labs  Lab 12/13/18 2204 12/13/18 2305  LATICACIDVEN 2.35* 1.90    Recent Results (from the past 240 hour(s))  Blood Culture (routine x 2)     Status: Abnormal   Collection Time: 12/13/18  9:25 PM  Result Value Ref Range Status   Specimen Description   Final    BLOOD LEFT ANTECUBITAL Performed at [facility], [place] [address] [place], [place] [address]    Special Requests   Final    BOTTLES DRAWN AEROBIC AND ANAEROBIC Blood Culture adequate volume Performed at [facility] [address] [place], [place] [address]    Culture  Setup Time   Final    AEROBIC BOTTLE ONLY GRAM POSITIVE COCCI Organism ID to follow CRITICAL RESULT CALLED TO, READ BACK BY AND VERIFIED WITH: A PHAN PHARMD 0027 12/16/18 A BROWNING    Culture (A)  Final    STAPHYLOCOCCUS SPECIES (COAGULASE NEGATIVE) THE SIGNIFICANCE OF ISOLATING THIS ORGANISM FROM A SINGLE SET OF BLOOD CULTURES WHEN MULTIPLE SETS ARE DRAWN IS UNCERTAIN. PLEASE NOTIFY THE MICROBIOLOGY DEPARTMENT WITHIN ONE WEEK IF SPECIATION AND SENSITIVITIES ARE REQUIRED. Performed at [facility], [place] [address] [place], [place] [address]    Report Status  12/17/2018 FINAL  Final  Urine culture     Status: Abnormal   Collection Time: 12/13/18  9:25 PM  Result Value Ref Range Status   Specimen Description   Final    URINE, CLEAN CATCH Performed at [facility], [place] [address], [place], [place] [address]    Special Requests   Final    NONE Performed at [facility], [place] [address], [place], [place] [address]    Culture MULTIPLE SPECIES PRESENT, SUGGEST RECOLLECTION (A)  Final   Report Status 12/16/2018 FINAL  Final  Blood Culture ID Panel (Reflexed)     Status:  Abnormal   Collection Time: 12/13/18  9:25 PM  Result Value Ref Range Status   Enterococcus species NOT DETECTED NOT DETECTED Final   Listeria monocytogenes NOT DETECTED NOT DETECTED Final   Staphylococcus species DETECTED (A) NOT DETECTED Final    Comment: Methicillin (oxacillin) susceptible coagulase negative staphylococcus. Possible blood culture contaminant (unless isolated from more than one blood culture draw or clinical case suggests pathogenicity). No antibiotic treatment is indicated for blood  culture contaminants. CRITICAL RESULT CALLED TO, READ BACK BY AND VERIFIED WITH: A PHAN PHARMD 0027 12/16/18 A BROWNING    Staphylococcus aureus (BCID) NOT DETECTED NOT DETECTED Final   Methicillin resistance NOT DETECTED NOT DETECTED Final   Streptococcus species NOT DETECTED NOT DETECTED Final   Streptococcus agalactiae NOT DETECTED NOT DETECTED Final   Streptococcus pneumoniae NOT DETECTED NOT DETECTED Final   Streptococcus pyogenes NOT DETECTED NOT DETECTED Final   Acinetobacter baumannii NOT DETECTED NOT DETECTED Final   Enterobacteriaceae species NOT DETECTED NOT DETECTED Final   Enterobacter cloacae complex NOT DETECTED NOT DETECTED Final   Escherichia coli NOT DETECTED NOT DETECTED Final   Klebsiella oxytoca NOT DETECTED NOT DETECTED Final   Klebsiella pneumoniae NOT DETECTED NOT DETECTED Final   Proteus species NOT DETECTED NOT  DETECTED Final   Serratia marcescens NOT DETECTED NOT DETECTED Final   Haemophilus influenzae NOT DETECTED NOT DETECTED Final   Neisseria meningitidis NOT DETECTED NOT DETECTED Final   Pseudomonas aeruginosa NOT DETECTED NOT DETECTED Final   Candida albicans NOT DETECTED NOT DETECTED Final   Candida glabrata NOT DETECTED NOT DETECTED Final   Candida krusei NOT DETECTED NOT DETECTED Final   Candida parapsilosis NOT DETECTED NOT DETECTED Final   Candida tropicalis NOT DETECTED NOT DETECTED Final    Comment: Performed at [facility], [place]. [address], [place], [place] [address]  Blood Culture (routine x 2)     Status: None (Preliminary result)   Collection Time: 12/13/18  9:30 PM  Result Value Ref Range Status   Specimen Description   Final    BLOOD LEFT ANTECUBITAL Performed at [place] [address] [place], [place] [address]    Special Requests   Final    BOTTLES DRAWN AEROBIC AND ANAEROBIC Blood Culture adequate volume Performed at [place] [address] [place], [place] [address]    Culture   Final    NO GROWTH 3 DAYS Performed at [facility], [place] [address], [place], [place] [address]    Report Status PENDING  Incomplete  MRSA PCR Screening     Status: None   Collection Time: 12/14/18 12:47 AM  Result Value Ref Range Status   MRSA by PCR NEGATIVE NEGATIVE Final    Comment:        The GeneXpert MRSA Assay (FDA approved for NASAL specimens only), is one component of a comprehensive MRSA colonization surveillance program. It is not intended to diagnose MRSA infection nor to guide or monitor treatment for MRSA infections. Performed at [facility], [place] [address] [place], [place] [address]          Radiology Studies: No results found.      Scheduled Meds: . aspirin EC  81 mg Oral Daily  . ciprofloxacin  500 mg Oral BID  . enoxaparin (LOVENOX) injection  40 mg Subcutaneous Q24H   . [START ON 12/18/2018] Influenza vac split quadrivalent PF  0.5 mL Intramuscular Tomorrow-1000  . lisinopril  20 mg Oral  QPM  . saccharomyces boulardii  250 mg Oral BID  . simvastatin  20 mg Oral QPC breakfast  . tamsulosin  0.4 mg Oral Daily   Continuous Infusions:    LOS: 4 days    Bonnielee Haff, MD [facility] [email or web address]  12/17/2018, 12:23 PM

## 2018-12-17 NOTE — Progress Notes (Signed)
Bladder Scan done per orders. Scan results 311. Pt voices need to urinate Condom Cath is in place and foley bag emptied and Pt assisted up to stand at bedside and voided 300

## 2018-12-18 MED ORDER — INFLUENZA VAC SPLIT HIGH-DOSE 0.5 ML IM SUSY: 0.5000 mL | PREFILLED_SYRINGE | INTRAMUSCULAR | Status: DC | PRN

## 2018-12-18 MED ORDER — LISINOPRIL 20 MG PO TABS
20.0000 mg | ORAL_TABLET | Freq: Two times a day (BID) | ORAL | Status: DC
  Administered 2018-12-18 – 2018-12-23 (×11): 20 mg via ORAL
  Filled 2018-12-18 (×11): qty 1

## 2018-12-18 NOTE — [place] FL2 (Signed)
[place] MEDICAID FL2 LEVEL OF CARE SCREENING TOOL     IDENTIFICATION  Patient Name: Pedro Palmer Birthdate: [birth year] Sex: male Admission Date (Current Location): 12/13/2018  [facility] and [place] Number:  Herbalist and Address:  [facility],  [place] [address], [place]      Provider Number: [id number]  Attending Physician Name and Address:  Bonnielee Haff, MD  Relative Name and Phone Number:       Current Level of Care: Hospital Recommended Level of Care: [place] Prior Approval Number:    Date Approved/Denied:   PASRR Number: [id number]  Discharge Plan:      Current Diagnoses: Patient Active Problem List   Diagnosis Date Noted  . Sepsis ([place]) 12/13/2018  . Paget disease of bone   . Colon cancer ([place]) 07/21/2012  . Hypertension 07/21/2012    Orientation RESPIRATION BLADDER Height & Weight     Self, Place, Situation  Normal Incontinent Weight: 135 lb (61.2 kg) Height:  5\' 7"  (170.2 cm)  BEHAVIORAL SYMPTOMS/MOOD NEUROLOGICAL BOWEL NUTRITION STATUS      Continent Diet(heart healthy diet)  AMBULATORY STATUS COMMUNICATION OF NEEDS Skin   Limited Assist Verbally Normal                       Personal Care Assistance Level of Assistance  Bathing, Feeding, Dressing Bathing Assistance: Limited assistance Feeding assistance: Independent Dressing Assistance: Limited assistance     Functional Limitations Info  Sight, Hearing, Speech Sight Info: Adequate Hearing Info: Adequate Speech Info: Adequate    SPECIAL CARE FACTORS FREQUENCY  PT (By licensed PT), OT (By licensed OT)     PT Frequency: 5x OT Frequency: 5x            Contractures Contractures Info: Not present    Additional Factors Info  Code Status, Allergies Code Status Info: full code Allergies Info: nka           Current Medications (12/18/2018):  This is the current hospital active medication list Current Facility-Administered  Medications  Medication Dose Route Frequency Provider Last Rate Last Dose  . acetaminophen (TYLENOL) tablet 650 mg  650 mg Oral Q6H PRN Colbert Ewing, MD   650 mg at 12/18/18 0908  . aspirin EC tablet 81 mg  81 mg Oral Daily Purohit, Shrey C, MD   81 mg at 12/18/18 0908  . ciprofloxacin (CIPRO) tablet 500 mg  500 mg Oral BID Bonnielee Haff, MD   500 mg at 12/18/18 0908  . enoxaparin (LOVENOX) injection 40 mg  40 mg Subcutaneous Q24H Colbert Ewing, MD   40 mg at 12/18/18 7425  . hydrALAZINE (APRESOLINE) injection 10 mg  10 mg Intravenous Q6H PRN Bonnielee Haff, MD   10 mg at 12/18/18 0705  . Influenza vac split quadrivalent PF (FLUZONE HIGH-DOSE) injection 0.5 mL  0.5 mL Intramuscular Prior to discharge Bonnielee Haff, MD      . iohexol (OMNIPAQUE) 300 MG/ML solution 15 mL  15 mL Oral Once PRN Purohit, Shrey C, MD      . lisinopril (PRINIVIL,ZESTRIL) tablet 20 mg  20 mg Oral BID Bonnielee Haff, MD      . saccharomyces boulardii (FLORASTOR) capsule 250 mg  250 mg Oral BID Bonnielee Haff, MD   250 mg at 12/18/18 0908  . simvastatin (ZOCOR) tablet 20 mg  20 mg Oral QPC breakfast Purohit, Shrey C, MD   20 mg at 12/18/18 0908  . tamsulosin (FLOMAX) capsule  0.4 mg  0.4 mg Oral Daily Bonnielee Haff, MD   0.4 mg at 12/18/18 0908     Discharge Medications: Please see discharge summary for a list of discharge medications.  Relevant Imaging Results:  Relevant Lab Results:   Additional Information SS# [id number]  Nila Nephew, LCSW

## 2018-12-18 NOTE — Clinical Social Work Note (Signed)
Clinical Social Work Assessment  Patient Details  Name: Pedro Palmer MRN: [id number] Date of Birth: [birth year]  Date of referral:  12/18/18               Reason for consult:  Facility Placement                Permission sought to share information with:  Family Supports Permission granted to share information::  Yes, Verbal Permission Granted  Name::     Pedro Palmer::     Relationship::     Contact Information:     Housing/Transportation Living arrangements for the past 2 months:  [place] of Information:  Medical Team, Spouse Patient Interpreter Needed:  None Criminal Activity/Legal Involvement Pertinent to Current Situation/Hospitalization:  No - Comment as needed Significant Relationships:  Spouse, Friend Lives with:  Spouse Do you feel safe going back to the place where you live?  Yes Need for family participation in patient care:  Yes (Comment)(Pedro)  Care giving concerns:  Pt admitted from home where he resides with his Pedro. At baseline very active and independent -"Likes to be out and about, outside and working on his motorcycle" Admitted for UTI/encephalopathy.    Social Worker assessment / plan:  CSW consulted to assist with SNF placement.  Discussed with Pedro- she is in agreement pt would benefit from short term rehab prior to returning home- "We think he wouldn't need more than a week or 2- he is very active and motivated to get up and around again." CSW completed referrals and will follow up with SNF bed offers. Explained need for prior authorization from [employer] Medicare once facility selected. Pedro adds that pt has anxiety re: admitting to rehab as "his brother was at one for a long time and passed away there. He doesn't want that to happen to him." CSW provided supportive counseling for apprehension and Pedro encouraging pt to consider his situation different from brother's.  Employment status:  Retired Chief Technology Officer) PT Recommendations:  [place] / Referral to community resources:  [place]  Patient/Family's Response to care:  Engaged/appreciative  Patient/Family's Understanding of and Emotional Response to Diagnosis, Current Treatment, and Prognosis:  See detailed above  Emotional Assessment Appearance:  Appears stated age Attitude/Demeanor/Rapport:  (calm- drowsy) Affect (typically observed):  Appropriate Orientation:  Oriented to Self, Oriented to Place, Oriented to Situation Alcohol / Substance use:  Not Applicable Psych involvement (Current and /or in the community):  No (Comment)  Discharge Needs  Concerns to be addressed:  Discharge Planning Concerns Readmission within the last 30 days:  No Current discharge risk:  None Barriers to Discharge:  Insurance Authorization   Nila Nephew, [place] 12/18/2018, 2:00 PM [phone]

## 2018-12-18 NOTE — Progress Notes (Signed)
Physical Therapy Treatment Patient Details Name: Pedro Palmer MRN: [id number] DOB: [birth year] Today's Date: 12/18/2018    History of Present Illness 85 year old with past medical history relevant for colon cancer status post hemicolectomy, remote history of pulmonary embolism not on anticoagulation, prostate cancer, hypertension, hyperlipidemia, Paget's disease of bone admitted with sepsis from urinary source, altered mental status    PT Comments    Pt reports feeling better and ambulated in hallway.  Pt also performed a few LE exercises.    Follow Up Recommendations  Supervision/Assistance - 24 hour;SNF     Equipment Recommendations  Rolling walker with 5" wheels    Recommendations for Other Services       Precautions / Restrictions Precautions Precautions: Fall    Mobility  Bed Mobility Overal bed mobility: Needs Assistance Bed Mobility: Supine to Sit;Sit to Supine     Supine to sit: Min assist Sit to supine: Min guard   General bed mobility comments: min/guard for safety, assist initially for cues  Transfers Overall transfer level: Needs assistance Equipment used: Rolling walker (2 wheeled) Transfers: Sit to/from Stand Sit to Stand: Min guard         General transfer comment: min/guard for safety, cues for hand placement  Ambulation/Gait Ambulation/Gait assistance: Min assist Gait Distance (Feet): 160 Feet Assistive device: Rolling walker (2 wheeled) Gait Pattern/deviations: Step-through pattern;Decreased stride length;Trunk flexed Gait velocity: decr   General Gait Details: verbal cues for RW positioning, posture, pt reports mild dizziness which did not resolve during ambulation, occasional assist for steadying   Stairs             Wheelchair Mobility    Modified Rankin (Stroke Patients Only)       Balance                                            Cognition Arousal/Alertness: Awake/alert Behavior During Therapy: WFL  for tasks assessed/performed Overall Cognitive Status: No family/caregiver present to determine baseline cognitive functioning                                 General Comments: pleasant and cooperative      Exercises General Exercises - Lower Extremity Long Arc Quad: AROM;10 reps;Seated;Both Hip Flexion/Marching: AROM;10 reps;Both;Seated Heel Raises: AROM;10 reps;Seated;Both(required UE support for steadying) Mini-Sqauts: AROM;5 reps;Both(without UEs for challenge)    General Comments        Pertinent Vitals/Pain Pain Assessment: No/denies pain    Home Living                      Prior Function            PT Goals (current goals can now be found in the care plan section) Progress towards PT goals: Progressing toward goals    Frequency    Min 3X/week      PT Plan Current plan remains appropriate    Co-evaluation              AM-PAC PT "6 Clicks" Mobility   Outcome Measure  Help needed turning from your back to your side while in a flat bed without using bedrails?: A Little Help needed moving from lying on your back to sitting on the side of a flat bed without using bedrails?: A Little Help needed moving to  and from a bed to a chair (including a wheelchair)?: A Little Help needed standing up from a chair using your arms (e.g., wheelchair or bedside chair)?: A Little Help needed to walk in hospital room?: A Little Help needed climbing 3-5 steps with a railing? : A Lot 6 Click Score: 17    End of Session Equipment Utilized During Treatment: Gait belt Activity Tolerance: Patient tolerated treatment well Patient left: in chair;with call bell/phone within reach   PT Visit Diagnosis: Other abnormalities of gait and mobility (R26.89)     Time: 0950-1004 PT Time Calculation (min) (ACUTE ONLY): 14 min  Charges:  $Gait Training: 8-22 mins                     Carmelia Bake, PT, DPT Acute Rehabilitation Services Office:  [phone] Pager: [place] 12/18/2018, 12:41 PM

## 2018-12-18 NOTE — Progress Notes (Signed)
PROGRESS NOTE    Pedro Palmer  YSA:[id number] DOB: 09-01-1933 DOA: 12/13/2018 PCP: Seward Carol, MD   Brief Narrative:  85 year old with past medical history relevant for colon cancer status post hemicolectomy, remote history of pulmonary embolism not on anticoagulation, prostate cancer, hypertension, hyperlipidemia, Paget's disease of bone admitted with sepsis from urinary source, altered mental status.  Patient was hospitalized.  Started on IV antibiotics.   Assessment & Plan:   Acute toxic encephalopathy  Secondary to urinary tract infection.  Now improved   Urinary tract infection with sepsis CT scan only showed distended bladder with diverticulum.  Patient was started on cefepime.  Blood cultures grew coag negative staph in 1 out of 2.  This is likely a contaminant.  Urine culture with growth of multiple species.  No previous culture data available.  Care everywhere also reviewed.  Patient was changed over to ciprofloxacin which he appears to be tolerating well..  Renal function is normal.  Patient remains stable.  Continue antibiotics to complete a 7-day course.  Coag negative staph bacteremia Only in 1 out of 2 sets.  Most likely contaminant.  Urinary retention Patient noted to have distended bladder on CT imaging and to have elevated postvoid residuals.  Flomax was initiated.  He has been able to void on own but he has to be standing up to do so.  Continue to do the same and avoid an indwelling catheter as much as possible.   AKI Resolved  Essential hypertension and dyslipidemia Pressure remains poorly controlled.  This is despite the lisinopril.  We will increase the dose of the lisinopril.  Continue statin.  Hydralazine as needed.  Remote history of PE Currently patient is off all anticoagulation  Remote history of colon cancer status post hemicolectomy Not an active problem  Prostate cancer See above.  Will need to follow-up with urology.  Paget's disease of  bone Alk phos mildly elevated  Prophylaxis: Enoxaparin  Disposition: Skilled nursing facility recommended by physical therapy for short-term rehab.  Discussed with his wife who has had multiple surgeries recently and would also like for the patient to get some rehab.  Social worker consulted.  Full code    Consultants:   None  Procedures:   None  Antimicrobials:   IV cefepime started 12/13/2018  Changed over to ciprofloxacin on 1/8   Subjective: Patient denies any complaints.  Overall he feels well.  No nausea vomiting.  Objective: Vitals:   12/17/18 2003 12/18/18 0412 12/18/18 0704 12/18/18 0846  BP: (!) 145/86 (!) 179/92 (!) 178/98 (!) 151/78  Pulse: 92 88 80 97  Resp: 18 18    Temp: 98.5 F (36.9 C) 98.2 F (36.8 C)    TempSrc: Oral     SpO2: 96% 96%    Weight:      Height:        Intake/Output Summary (Last 24 hours) at 12/18/2018 1112 Last data filed at 12/18/2018 1012 Gross per 24 hour  Intake -  Output 1246 ml  Net -1246 ml   Filed Weights   12/13/18 2107  Weight: 61.2 kg    Examination:  General appearance: Awake alert.  In no distress Resp: Clear to auscultation bilaterally.  Normal effort Cardio: S1-S2 is normal regular.  No S3-S4.  No rubs murmurs or bruit GI: Abdomen is soft.  Nontender nondistended.  Bowel sounds are present normal.  No masses organomegaly Extremities: No edema.  Full range of motion of lower extremities. Neurologic: Alert and oriented x3.  No focal neurological deficits.      Data Reviewed: I have personally reviewed following labs and imaging studies  CBC: Recent Labs  Lab 12/13/18 2125 12/13/18 2306 12/14/18 0333 12/15/18 0315 12/16/18 0356 12/17/18 0421  WBC 9.3  --  18.8* 21.5* 16.3* 8.1  NEUTROABS 8.9*  --  17.5*  --   --   --   HGB 16.1 15.3 13.2 13.0 13.4 14.0  HCT 50.1 45.0 41.2 40.1 41.2 42.4  MCV 101.6*  --  103.3* 101.0* 99.8 99.1  PLT 119*  --  121* 101* 100* 433*   Basic Metabolic  Panel: Recent Labs  Lab 12/13/18 2125 12/13/18 2306 12/14/18 0333 12/15/18 0315 12/16/18 0356 12/17/18 0421  NA 139 141 142 140 141 141  K 3.6 3.3* 3.6 4.1 3.8 3.7  CL 106 109 111 113* 113* 112*  CO2 24  --  22 21* 21* 21*  GLUCOSE 162* 182* 178* 107* 132* 118*  BUN 23 23 27* '17 14 9  '$ CREATININE 1.36* 1.20 1.45* 1.02 0.86 0.83  CALCIUM 9.7  --  8.7* 8.8* 9.1 9.0  MG  --   --  1.6* 2.0 1.9 1.9   GFR: Estimated Creatinine Clearance: 58.4 mL/min (by C-G formula based on SCr of 0.83 mg/dL).  Liver Function Tests: Recent Labs  Lab 12/13/18 2125 12/15/18 0315  AST 47* 35  ALT 38 32  ALKPHOS 253* 164*  BILITOT 1.0 0.6  PROT 6.7 5.6*  ALBUMIN 4.0 3.1*   CBG: Recent Labs  Lab 12/16/18 0733  GLUCAP 119*   Sepsis Labs: Recent Labs  Lab 12/13/18 2204 12/13/18 2305  LATICACIDVEN 2.35* 1.90    Recent Results (from the past 240 hour(s))  Blood Culture (routine x 2)     Status: Abnormal   Collection Time: 12/13/18  9:25 PM  Result Value Ref Range Status   Specimen Description   Final    BLOOD LEFT ANTECUBITAL Performed at [facility], [place] [address] [place], [place] [address]    Special Requests   Final    BOTTLES DRAWN AEROBIC AND ANAEROBIC Blood Culture adequate volume Performed at [place] [address] [place], [place] [address]    Culture  Setup Time   Final    AEROBIC BOTTLE ONLY GRAM POSITIVE COCCI Organism ID to follow CRITICAL RESULT CALLED TO, READ BACK BY AND VERIFIED WITH: A PHAN PHARMD [id number] 12/16/18 A BROWNING    Culture (A)  Final    STAPHYLOCOCCUS SPECIES (COAGULASE NEGATIVE) THE SIGNIFICANCE OF ISOLATING THIS ORGANISM FROM A SINGLE SET OF BLOOD CULTURES WHEN MULTIPLE SETS ARE DRAWN IS UNCERTAIN. PLEASE NOTIFY THE MICROBIOLOGY DEPARTMENT WITHIN ONE WEEK IF SPECIATION AND SENSITIVITIES ARE REQUIRED. Performed at [facility], [place] [address] [place], [place] [address]    Report Status 12/17/2018  FINAL  Final  Urine culture     Status: Abnormal   Collection Time: 12/13/18  9:25 PM  Result Value Ref Range Status   Specimen Description   Final    URINE, CLEAN CATCH Performed at [facility], [place] [address], [place], [place] [address]    Special Requests   Final    NONE Performed at [facility], [place] [address] [place], [place] [address]    Culture MULTIPLE SPECIES PRESENT, SUGGEST RECOLLECTION (A)  Final   Report Status 12/16/2018 FINAL  Final  Blood Culture ID Panel (Reflexed)     Status: Abnormal   Collection Time: 12/13/18  9:25 PM  Result Value Ref Range Status   Enterococcus species NOT DETECTED NOT DETECTED Final   Listeria monocytogenes NOT DETECTED NOT DETECTED Final   Staphylococcus species DETECTED (A) NOT DETECTED Final    Comment: Methicillin (oxacillin) susceptible coagulase negative staphylococcus. Possible blood culture contaminant (unless isolated from more than one blood culture draw or clinical case suggests pathogenicity). No antibiotic treatment is indicated for blood  culture contaminants. CRITICAL RESULT CALLED TO, READ BACK BY AND VERIFIED WITH: A PHAN PHARMD 0027 12/16/18 A BROWNING    Staphylococcus aureus (BCID) NOT DETECTED NOT DETECTED Final   Methicillin resistance NOT DETECTED NOT DETECTED Final   Streptococcus species NOT DETECTED NOT DETECTED Final   Streptococcus agalactiae NOT DETECTED NOT DETECTED Final   Streptococcus pneumoniae NOT DETECTED NOT DETECTED Final   Streptococcus pyogenes NOT DETECTED NOT DETECTED Final   Acinetobacter baumannii NOT DETECTED NOT DETECTED Final   Enterobacteriaceae species NOT DETECTED NOT DETECTED Final   Enterobacter cloacae complex NOT DETECTED NOT DETECTED Final   Escherichia coli NOT DETECTED NOT DETECTED Final   Klebsiella oxytoca NOT DETECTED NOT DETECTED Final   Klebsiella pneumoniae NOT DETECTED NOT DETECTED Final   Proteus species NOT DETECTED NOT DETECTED Final    Serratia marcescens NOT DETECTED NOT DETECTED Final   Haemophilus influenzae NOT DETECTED NOT DETECTED Final   Neisseria meningitidis NOT DETECTED NOT DETECTED Final   Pseudomonas aeruginosa NOT DETECTED NOT DETECTED Final   Candida albicans NOT DETECTED NOT DETECTED Final   Candida glabrata NOT DETECTED NOT DETECTED Final   Candida krusei NOT DETECTED NOT DETECTED Final   Candida parapsilosis NOT DETECTED NOT DETECTED Final   Candida tropicalis NOT DETECTED NOT DETECTED Final    Comment: Performed at [facility], [place]. [address], [place], [place] [address]  Blood Culture (routine x 2)     Status: None (Preliminary result)   Collection Time: 12/13/18  9:30 PM  Result Value Ref Range Status   Specimen Description   Final    BLOOD LEFT ANTECUBITAL Performed at [place] [address] [place], [place] [address]    Special Requests   Final    BOTTLES DRAWN AEROBIC AND ANAEROBIC Blood Culture adequate volume Performed at [place] [address], [place], [place] [address]    Culture   Final    NO GROWTH 4 DAYS Performed at [facility], [place] [address], [place], [place] [address]    Report Status PENDING  Incomplete  MRSA PCR Screening     Status: None   Collection Time: 12/14/18 12:47 AM  Result Value Ref Range Status   MRSA by PCR NEGATIVE NEGATIVE Final    Comment:        The GeneXpert MRSA Assay (FDA approved for NASAL specimens only), is one component of a comprehensive MRSA colonization surveillance program. It is not intended to diagnose MRSA infection nor to guide or monitor treatment for MRSA infections. Performed at [facility], [place] [address], [place],  [address]          Radiology Studies: No results found.      Scheduled Meds: . aspirin EC  81 mg Oral Daily  . ciprofloxacin  500 mg Oral BID  . enoxaparin (LOVENOX) injection  40 mg Subcutaneous Q24H  . lisinopril   20 mg Oral QPM  . saccharomyces boulardii  250 mg Oral BID  . simvastatin  20 mg Oral QPC breakfast  . tamsulosin  0.4 mg Oral Daily   Continuous Infusions:    LOS: 5 days    Bonnielee Haff, MD [facility] [email or web address]  12/18/2018, 11:12 AM

## 2018-12-19 LAB — BASIC METABOLIC PANEL
ANION GAP: 7 (ref 5–15)
BUN: 11 mg/dL (ref 8–23)
CO2: 23 mmol/L (ref 22–32)
Calcium: 9.5 mg/dL (ref 8.9–10.3)
Chloride: 111 mmol/L (ref 98–111)
Creatinine, Ser: 0.9 mg/dL (ref 0.61–1.24)
GFR calc Af Amer: >60 mL/min (ref >60)
GFR calc non Af Amer: >60 mL/min (ref >60)
Glucose, Bld: 121 mg/dL — ABNORMAL HIGH (ref 70–99)
Potassium: 4.1 mmol/L (ref 3.5–5.1)
Sodium: 141 mmol/L (ref 135–145)

## 2018-12-19 LAB — CULTURE, BLOOD (ROUTINE X 2)
Culture: NO GROWTH
Special Requests: ADEQUATE

## 2018-12-19 NOTE — Progress Notes (Signed)
PROGRESS NOTE    Pedro Palmer  YCX:[id number] DOB: 1933/07/04 DOA: 12/13/2018 PCP: Seward Carol, MD   Brief Narrative:  85 year old with past medical history relevant for colon cancer status post hemicolectomy, remote history of pulmonary embolism not on anticoagulation, prostate cancer, hypertension, hyperlipidemia, Paget's disease of bone admitted with sepsis from urinary source, altered mental status.  Patient was hospitalized.  Started on IV antibiotics.  Patient started improving.  Transitioned to oral antibiotics.  Seen by physical therapy who recommends short-term rehab.   Assessment & Plan:   Acute toxic encephalopathy  Secondary to urinary tract infection.  Now improved.  Seems to be back to baseline.  Urinary tract infection with sepsis CT scan only showed distended bladder with diverticulum.  Patient was started on cefepime.  Blood cultures grew coag negative staph in 1 out of 2.  This is likely a contaminant.  Urine culture with growth of multiple species.  No previous culture data available.  Care everywhere also reviewed.  Patient was changed over to ciprofloxacin which he appears to be tolerating well..  Renal function is normal.  Patient remains stable.  Continue antibiotics to complete a 7-day course.  Plan on stopping treatment after doses on 1/12.  Coag negative staph bacteremia Only in 1 out of 2 sets.  Most likely contaminant.  Urinary retention Patient noted to have distended bladder on CT imaging and to have elevated postvoid residuals.  Flomax was initiated.  He has been able to void on own but he has to be standing up to do so.  Continue to do the same and avoid an indwelling catheter as much as possible.  Outpatient urology consultation.  AKI Resolved  Essential hypertension and dyslipidemia Patient remained poorly controlled despite lisinopril.  Dose was increased yesterday.  Continue to monitor for now.  Hydralazine as needed.  Continue statin.    Remote  history of PE Currently patient is off all anticoagulation  Remote history of colon cancer status post hemicolectomy Not an active problem  Prostate cancer See above.  Will need to follow-up with urology.  Paget's disease of bone Alk phos mildly elevated  Prophylaxis: Enoxaparin  Disposition: Skilled nursing facility recommended by physical therapy for short-term rehab.  Discussed with his wife who has had multiple surgeries recently and would also like for the patient to get some rehab.  Social worker consulted.  Full code  PT/OT evaluations performed. SNF recommended. SNF appropriate as the patient has received 3 days of hospital care and is felt to need rehab services to restore this patient to their prior level of function to achieve safe transition back to home care. This patient needs rehab services for at least 5 days per week and skilled nursing services daily to facilitate this transition. Rehab is being requested as the most appropriate d/c option for this patient and is NOT felt to be for custodial care as evidenced by independent level of functioning prior to hospitalization.     Consultants:   None  Procedures:   None  Antimicrobials:   IV cefepime started 12/13/2018  Changed over to ciprofloxacin on 1/8   Subjective: Patient denies any complaints this morning.  Seems to be slightly anxious about the possibility of going to rehab but he understands the need to do so.  Denies any abdominal pain.  Objective: Vitals:   12/18/18 0846 12/18/18 1500 12/18/18 2005 12/19/18 0533  BP: (!) 151/78 (!) 162/76 (!) 158/89 (!) 157/93  Pulse: 97 (!) 57 87 85  Resp:  12   Temp:  98 F (36.7 C) 98.6 F (37 C) 99.1 F (37.3 C)  TempSrc:  Oral Oral Oral  SpO2:  97% 95% 95%  Weight:      Height:        Intake/Output Summary (Last 24 hours) at 12/19/2018 1255 Last data filed at 12/19/2018 0834 Gross per 24 hour  Intake 240 ml  Output 500 ml  Net -260 ml   Filed  Weights   12/13/18 2107  Weight: 61.2 kg    Examination:  General appearance: Awake alert.  In no distress Resp: Clear to auscultation bilaterally.  Normal effort Cardio: S1-S2 is normal regular.  No S3-S4.  No rubs murmurs or bruit GI: Abdomen is soft.  Nontender nondistended.  Bowel sounds are present normal.  No masses organomegaly Extremities: No edema.  Full range of motion of lower extremities. Neurologic: Alert and oriented x3.  No focal neurological deficits.    Data Reviewed: I have personally reviewed following labs and imaging studies  CBC: Recent Labs  Lab 12/13/18 2125 12/13/18 2306 12/14/18 0333 12/15/18 0315 12/16/18 0356 12/17/18 0421  WBC 9.3  --  18.8* 21.5* 16.3* 8.1  NEUTROABS 8.9*  --  17.5*  --   --   --   HGB 16.1 15.3 13.2 13.0 13.4 14.0  HCT 50.1 45.0 41.2 40.1 41.2 42.4  MCV 101.6*  --  103.3* 101.0* 99.8 99.1  PLT 119*  --  121* 101* 100* 462*   Basic Metabolic Panel: Recent Labs  Lab 12/14/18 0333 12/15/18 0315 12/16/18 0356 12/17/18 0421 12/19/18 0526  NA 142 140 141 141 141  K 3.6 4.1 3.8 3.7 4.1  CL 111 113* 113* 112* 111  CO2 22 21* 21* 21* 23  GLUCOSE 178* 107* 132* 118* 121*  BUN 27* '17 14 9 11  '$ CREATININE 1.45* 1.02 0.86 0.83 0.90  CALCIUM 8.7* 8.8* 9.1 9.0 9.5  MG 1.6* 2.0 1.9 1.9  --    GFR: Estimated Creatinine Clearance: 53.8 mL/min (by C-G formula based on SCr of 0.9 mg/dL).  Liver Function Tests: Recent Labs  Lab 12/13/18 2125 12/15/18 0315  AST 47* 35  ALT 38 32  ALKPHOS 253* 164*  BILITOT 1.0 0.6  PROT 6.7 5.6*  ALBUMIN 4.0 3.1*   CBG: Recent Labs  Lab 12/16/18 0733  GLUCAP 119*   Sepsis Labs: Recent Labs  Lab 12/13/18 2204 12/13/18 2305  LATICACIDVEN 2.35* 1.90    Recent Results (from the past 240 hour(s))  Blood Culture (routine x 2)     Status: Abnormal   Collection Time: 12/13/18  9:25 PM  Result Value Ref Range Status   Specimen Description   Final    BLOOD LEFT ANTECUBITAL Performed  at [facility], [place] [address] [place], [place] [address]    Special Requests   Final    BOTTLES DRAWN AEROBIC AND ANAEROBIC Blood Culture adequate volume Performed at [place] [address] [place], [place] [address]    Culture  Setup Time   Final    AEROBIC BOTTLE ONLY GRAM POSITIVE COCCI Organism ID to follow CRITICAL RESULT CALLED TO, READ BACK BY AND VERIFIED WITH: A PHAN PHARMD [id number] 12/16/18 A BROWNING    Culture (A)  Final    STAPHYLOCOCCUS SPECIES (COAGULASE NEGATIVE) THE SIGNIFICANCE OF ISOLATING THIS ORGANISM FROM A SINGLE SET OF BLOOD CULTURES WHEN MULTIPLE SETS ARE DRAWN IS UNCERTAIN. PLEASE NOTIFY THE MICROBIOLOGY DEPARTMENT WITHIN ONE WEEK IF SPECIATION AND  SENSITIVITIES ARE REQUIRED. Performed at [facility], [place] [address], [place], [place] [address]    Report Status 12/17/2018 FINAL  Final  Urine culture     Status: Abnormal   Collection Time: 12/13/18  9:25 PM  Result Value Ref Range Status   Specimen Description   Final    URINE, CLEAN CATCH Performed at [facility], [place] [address], [place], [place] [address]    Special Requests   Final    NONE Performed at [facility], [place] [address], [place], [place] [address]    Culture MULTIPLE SPECIES PRESENT, SUGGEST RECOLLECTION (A)  Final   Report Status 12/16/2018 FINAL  Final  Blood Culture ID Panel (Reflexed)     Status: Abnormal   Collection Time: 12/13/18  9:25 PM  Result Value Ref Range Status   Enterococcus species NOT DETECTED NOT DETECTED Final   Listeria monocytogenes NOT DETECTED NOT DETECTED Final   Staphylococcus species DETECTED (A) NOT DETECTED Final    Comment: Methicillin (oxacillin) susceptible coagulase negative staphylococcus. Possible blood culture contaminant (unless isolated from more than one blood culture draw or clinical case suggests pathogenicity). No antibiotic treatment is indicated for blood    culture contaminants. CRITICAL RESULT CALLED TO, READ BACK BY AND VERIFIED WITH: A PHAN PHARMD 0027 12/16/18 A BROWNING    Staphylococcus aureus (BCID) NOT DETECTED NOT DETECTED Final   Methicillin resistance NOT DETECTED NOT DETECTED Final   Streptococcus species NOT DETECTED NOT DETECTED Final   Streptococcus agalactiae NOT DETECTED NOT DETECTED Final   Streptococcus pneumoniae NOT DETECTED NOT DETECTED Final   Streptococcus pyogenes NOT DETECTED NOT DETECTED Final   Acinetobacter baumannii NOT DETECTED NOT DETECTED Final   Enterobacteriaceae species NOT DETECTED NOT DETECTED Final   Enterobacter cloacae complex NOT DETECTED NOT DETECTED Final   Escherichia coli NOT DETECTED NOT DETECTED Final   Klebsiella oxytoca NOT DETECTED NOT DETECTED Final   Klebsiella pneumoniae NOT DETECTED NOT DETECTED Final   Proteus species NOT DETECTED NOT DETECTED Final   Serratia marcescens NOT DETECTED NOT DETECTED Final   Haemophilus influenzae NOT DETECTED NOT DETECTED Final   Neisseria meningitidis NOT DETECTED NOT DETECTED Final   Pseudomonas aeruginosa NOT DETECTED NOT DETECTED Final   Candida albicans NOT DETECTED NOT DETECTED Final   Candida glabrata NOT DETECTED NOT DETECTED Final   Candida krusei NOT DETECTED NOT DETECTED Final   Candida parapsilosis NOT DETECTED NOT DETECTED Final   Candida tropicalis NOT DETECTED NOT DETECTED Final    Comment: Performed at [facility], [place]. [address], [place], [place] [address]  Blood Culture (routine x 2)     Status: None   Collection Time: 12/13/18  9:30 PM  Result Value Ref Range Status   Specimen Description   Final    BLOOD LEFT ANTECUBITAL Performed at [place] [address] [place], [place] [address]    Special Requests   Final    BOTTLES DRAWN AEROBIC AND ANAEROBIC Blood Culture adequate volume Performed at [place] [address], [place], [place] [address]    Culture   Final    NO  GROWTH 5 DAYS Performed at [facility], [place] [address], [place], [place] [address]    Report Status 12/19/2018 FINAL  Final  MRSA PCR Screening     Status: None   Collection Time: 12/14/18 12:47 AM  Result Value Ref Range Status   MRSA by PCR NEGATIVE NEGATIVE Final  Comment:        The GeneXpert MRSA Assay (FDA approved for NASAL specimens only), is one component of a comprehensive MRSA colonization surveillance program. It is not intended to diagnose MRSA infection nor to guide or monitor treatment for MRSA infections. Performed at [facility], [place] [address] [place], [place] [address]          Radiology Studies: No results found.   Scheduled Meds: . aspirin EC  81 mg Oral Daily  . ciprofloxacin  500 mg Oral BID  . enoxaparin (LOVENOX) injection  40 mg Subcutaneous Q24H  . lisinopril  20 mg Oral BID  . saccharomyces boulardii  250 mg Oral BID  . simvastatin  20 mg Oral QPC breakfast  . tamsulosin  0.4 mg Oral Daily   Continuous Infusions:    LOS: 6 days    Bonnielee Haff, MD [facility] [email or web address]  12/19/2018, 12:55 PM

## 2018-12-20 DIAGNOSIS — K5900 Constipation, unspecified: Secondary | ICD-10-CM

## 2018-12-20 MED ORDER — POLYETHYLENE GLYCOL 3350 17 G PO PACK
17.0000 g | PACK | Freq: Every day | ORAL | Status: DC
  Administered 2018-12-20 – 2018-12-23 (×4): 17 g via ORAL
  Filled 2018-12-20 (×4): qty 1

## 2018-12-20 MED ORDER — BISACODYL 10 MG RE SUPP: 10.0000 mg | Freq: Every day | RECTAL | Status: DC | PRN

## 2018-12-20 NOTE — Progress Notes (Signed)
Patient voided 337mL, PVR = 125mL.  Pedro Palmer. Brigitte Pulse, RN

## 2018-12-20 NOTE — Progress Notes (Signed)
PROGRESS NOTE    Pedro Palmer  IOE:[id number] DOB: July 24, 1933 DOA: 12/13/2018 PCP: Seward Carol, MD   Brief Narrative:  85 year old with past medical history relevant for colon cancer status post hemicolectomy, remote history of pulmonary embolism not on anticoagulation, prostate cancer, hypertension, hyperlipidemia, Paget's disease of bone admitted with sepsis from urinary source, altered mental status.  Patient was hospitalized.  Started on IV antibiotics.  Patient started improving.  Transitioned to oral antibiotics.  Seen by physical therapy who recommends short-term rehab.   Assessment & Plan:   Acute toxic encephalopathy  Secondary to urinary tract infection.  Has improved with treatment.  Seems to be back to baseline.  Urinary tract infection with sepsis CT scan only showed distended bladder with diverticulum.  Patient was started on cefepime.  Blood cultures grew coag negative staph in 1 out of 2.  This is likely a contaminant.  Urine culture with growth of multiple species.  No previous culture data available.  Care everywhere also reviewed.  Patient was changed over to ciprofloxacin which he appears to be tolerating well..  Renal function is normal.  Patient remains stable.  Continue antibiotics to complete a 7-day course.  Today will be the last day.  Coag negative staph bacteremia Only in 1 out of 2 sets.  Most likely contaminant.  Urinary retention Patient noted to have distended bladder on CT imaging and to have elevated postvoid residuals.  Flomax was initiated.  He has been able to void on own but he has to be standing up to do so.  Continue to do the same and avoid an indwelling catheter as much as possible.  Outpatient urology consultation.  Constipation Has not had a bowel movement in a few days.  MiraLAX.  AKI Resolved  Essential hypertension and dyslipidemia Patient remained poorly controlled despite lisinopril.  Dose was increased on 1/10.  Blood pressure appears  to be improving slightly.  We will not make any further changes for now.  Continue statin.   Remote history of PE Currently patient is off all anticoagulation  Remote history of colon cancer status post hemicolectomy Not an active problem  Prostate cancer See above.  Will need to follow-up with urology.  Paget's disease of bone Alk phos mildly elevated  Prophylaxis: Enoxaparin  Disposition: Skilled nursing facility recommended by physical therapy for short-term rehab.  Discussed with his wife who has had multiple surgeries recently and would also like for the patient to get some rehab.  Social worker consulted.  Full code  PT/OT evaluations performed. SNF recommended. SNF appropriate as the patient has received 3 days of hospital care and is felt to need rehab services to restore this patient to their prior level of function to achieve safe transition back to home care. This patient needs rehab services for at least 5 days per week and skilled nursing services daily to facilitate this transition. Rehab is being requested as the most appropriate d/c option for this patient and is NOT felt to be for custodial care as evidenced by independent level of functioning prior to hospitalization.     Consultants:   None  Procedures:   None  Antimicrobials:   IV cefepime started 12/13/2018  Changed over to ciprofloxacin on 1/8   Subjective: Patient complains of constipation.  No abdominal pain nausea or vomiting.  Otherwise feels well.  Objective: Vitals:   12/19/18 0533 12/19/18 1316 12/19/18 2058 12/20/18 0506  BP: (!) 157/93 (!) 153/94 (!) 156/93 (!) 159/94  Pulse: 85 (!)  103 86 89  Resp:  19 18   Temp: 99.1 F (37.3 C) 98.3 F (36.8 C) 98.4 F (36.9 C) 98.2 F (36.8 C)  TempSrc: Oral Oral Oral   SpO2: 95% 97% 98% 96%  Weight:      Height:        Intake/Output Summary (Last 24 hours) at 12/20/2018 0953 Last data filed at 12/19/2018 2300 Gross per 24 hour  Intake 420  ml  Output 700 ml  Net -280 ml   Filed Weights   12/13/18 2107  Weight: 61.2 kg    Examination:  General appearance: Awake alert.  In no distress Resp: Clear to auscultation bilaterally.  Normal effort Cardio: S1-S2 is normal regular.  No S3-S4.  No rubs murmurs or bruit GI: Abdomen is soft.  Nontender nondistended.  Bowel sounds are present normal.  No masses organomegaly Extremities: No edema.  Full range of motion of lower extremities. Neurologic: Alert and oriented x3.  No focal neurological deficits.     Data Reviewed: I have personally reviewed following labs and imaging studies  CBC: Recent Labs  Lab 12/13/18 2125 12/13/18 2306 12/14/18 0333 12/15/18 0315 12/16/18 0356 12/17/18 0421  WBC 9.3  --  18.8* 21.5* 16.3* 8.1  NEUTROABS 8.9*  --  17.5*  --   --   --   HGB 16.1 15.3 13.2 13.0 13.4 14.0  HCT 50.1 45.0 41.2 40.1 41.2 42.4  MCV 101.6*  --  103.3* 101.0* 99.8 99.1  PLT 119*  --  121* 101* 100* 697*   Basic Metabolic Panel: Recent Labs  Lab 12/14/18 0333 12/15/18 0315 12/16/18 0356 12/17/18 0421 12/19/18 0526  NA 142 140 141 141 141  K 3.6 4.1 3.8 3.7 4.1  CL 111 113* 113* 112* 111  CO2 22 21* 21* 21* 23  GLUCOSE 178* 107* 132* 118* 121*  BUN 27* _0 CREATININE 1.45* 1.02 0.86 0.83 0.90  CALCIUM 8.7* 8.8* 9.1 9.0 9.5  MG 1.6* 2.0 1.9 1.9  --    GFR: Estimated Creatinine Clearance: 53.8 mL/min (by C-G formula based on SCr of 0.9 mg/dL).  Liver Function Tests: Recent Labs  Lab 12/13/18 2125 12/15/18 0315  AST 47* 35  ALT 38 32  ALKPHOS 253* 164*  BILITOT 1.0 0.6  PROT 6.7 5.6*  ALBUMIN 4.0 3.1*   CBG: Recent Labs  Lab 12/16/18 0733  GLUCAP 119*   Sepsis Labs: Recent Labs  Lab 12/13/18 2204 12/13/18 2305  LATICACIDVEN 2.35* 1.90    Recent Results (from the past 240 hour(s))  Blood Culture (routine x 2)     Status: Abnormal   Collection Time: 12/13/18  9:25 PM  Result Value Ref Range Status   Specimen Description    Final    BLOOD LEFT ANTECUBITAL Performed at [facility], [place] [address], [place], [place] [address]    Special Requests   Final    BOTTLES DRAWN AEROBIC AND ANAEROBIC Blood Culture adequate volume Performed at [place] [address] [place], [place] [address]    Culture  Setup Time   Final    AEROBIC BOTTLE ONLY GRAM POSITIVE COCCI Organism ID to follow CRITICAL RESULT CALLED TO, READ BACK BY AND VERIFIED WITH: A PHAN PHARMD [id number] 12/16/18 A BROWNING    Culture (A)  Final    STAPHYLOCOCCUS SPECIES (COAGULASE NEGATIVE) THE SIGNIFICANCE OF ISOLATING THIS ORGANISM FROM A SINGLE SET OF BLOOD CULTURES WHEN MULTIPLE SETS ARE DRAWN IS UNCERTAIN.  PLEASE NOTIFY THE MICROBIOLOGY DEPARTMENT WITHIN ONE WEEK IF SPECIATION AND SENSITIVITIES ARE REQUIRED. Performed at [facility], [place] [address], [place], [place] [address]    Report Status 12/17/2018 FINAL  Final  Urine culture     Status: Abnormal   Collection Time: 12/13/18  9:25 PM  Result Value Ref Range Status   Specimen Description   Final    URINE, CLEAN CATCH Performed at [facility], [place] [address], [place], [place] [address]    Special Requests   Final    NONE Performed at [facility], [place] [address], [place], [place] [address]    Culture MULTIPLE SPECIES PRESENT, SUGGEST RECOLLECTION (A)  Final   Report Status 12/16/2018 FINAL  Final  Blood Culture ID Panel (Reflexed)     Status: Abnormal   Collection Time: 12/13/18  9:25 PM  Result Value Ref Range Status   Enterococcus species NOT DETECTED NOT DETECTED Final   Listeria monocytogenes NOT DETECTED NOT DETECTED Final   Staphylococcus species DETECTED (A) NOT DETECTED Final    Comment: Methicillin (oxacillin) susceptible coagulase negative staphylococcus. Possible blood culture contaminant (unless isolated from more than one blood culture draw or clinical case suggests pathogenicity). No  antibiotic treatment is indicated for blood  culture contaminants. CRITICAL RESULT CALLED TO, READ BACK BY AND VERIFIED WITH: A PHAN PHARMD 0027 12/16/18 A BROWNING    Staphylococcus aureus (BCID) NOT DETECTED NOT DETECTED Final   Methicillin resistance NOT DETECTED NOT DETECTED Final   Streptococcus species NOT DETECTED NOT DETECTED Final   Streptococcus agalactiae NOT DETECTED NOT DETECTED Final   Streptococcus pneumoniae NOT DETECTED NOT DETECTED Final   Streptococcus pyogenes NOT DETECTED NOT DETECTED Final   Acinetobacter baumannii NOT DETECTED NOT DETECTED Final   Enterobacteriaceae species NOT DETECTED NOT DETECTED Final   Enterobacter cloacae complex NOT DETECTED NOT DETECTED Final   Escherichia coli NOT DETECTED NOT DETECTED Final   Klebsiella oxytoca NOT DETECTED NOT DETECTED Final   Klebsiella pneumoniae NOT DETECTED NOT DETECTED Final   Proteus species NOT DETECTED NOT DETECTED Final   Serratia marcescens NOT DETECTED NOT DETECTED Final   Haemophilus influenzae NOT DETECTED NOT DETECTED Final   Neisseria meningitidis NOT DETECTED NOT DETECTED Final   Pseudomonas aeruginosa NOT DETECTED NOT DETECTED Final   Candida albicans NOT DETECTED NOT DETECTED Final   Candida glabrata NOT DETECTED NOT DETECTED Final   Candida krusei NOT DETECTED NOT DETECTED Final   Candida parapsilosis NOT DETECTED NOT DETECTED Final   Candida tropicalis NOT DETECTED NOT DETECTED Final    Comment: Performed at [facility], [place]. [address] [place], [place] [address]  Blood Culture (routine x 2)     Status: None   Collection Time: 12/13/18  9:30 PM  Result Value Ref Range Status   Specimen Description   Final    BLOOD LEFT ANTECUBITAL Performed at [place] [address] [place], [place] [address]    Special Requests   Final    BOTTLES DRAWN AEROBIC AND ANAEROBIC Blood Culture adequate volume Performed at [place] [address]  [place], [place] [address]    Culture   Final    NO GROWTH 5 DAYS Performed at [facility], [place] [address], [place], [place] [address]    Report Status 12/19/2018 FINAL  Final  MRSA PCR Screening     Status: None   Collection Time: 12/14/18 12:47 AM  Result Value Ref  Range Status   MRSA by PCR NEGATIVE NEGATIVE Final    Comment:        The GeneXpert MRSA Assay (FDA approved for NASAL specimens only), is one component of a comprehensive MRSA colonization surveillance program. It is not intended to diagnose MRSA infection nor to guide or monitor treatment for MRSA infections. Performed at [facility], [place] [address] [place], [place] [address]          Radiology Studies: No results found.   Scheduled Meds: . aspirin EC  81 mg Oral Daily  . ciprofloxacin  500 mg Oral BID  . enoxaparin (LOVENOX) injection  40 mg Subcutaneous Q24H  . lisinopril  20 mg Oral BID  . saccharomyces boulardii  250 mg Oral BID  . simvastatin  20 mg Oral QPC breakfast  . tamsulosin  0.4 mg Oral Daily   Continuous Infusions:    LOS: 7 days    Bonnielee Haff, MD [facility] [email or web address]  12/20/2018, 9:53 AM

## 2018-12-21 NOTE — Progress Notes (Signed)
Spoke with patient and he requestt [facility] care for Rehabilitation

## 2018-12-21 NOTE — Progress Notes (Signed)
Physical Therapy Treatment Patient Details Name: Pedro Palmer MRN: [id number] DOB: [birth year] Today's Date: 12/21/2018    History of Present Illness 85 year old with past medical history relevant for colon cancer status post hemicolectomy, remote history of pulmonary embolism not on anticoagulation, prostate cancer, hypertension, hyperlipidemia, Paget's disease of bone admitted with sepsis from urinary source, altered mental status    PT Comments    Pt performed LE exercises in sitting and standing.  Pt ambulated in hallway without assistive device to challenge balance today however aware to continue to use RW with nursing.  Pt anticipates d/c to SNF soon.  Follow Up Recommendations  Supervision/Assistance - 24 hour;SNF     Equipment Recommendations  Rolling walker with 5" wheels    Recommendations for Other Services       Precautions / Restrictions Precautions Precautions: Fall Restrictions Weight Bearing Restrictions: No    Mobility  Bed Mobility               General bed mobility comments: pt up in recliner on arrival  Transfers Overall transfer level: Needs assistance Equipment used: Rolling walker (2 wheeled) Transfers: Sit to/from Stand Sit to Stand: Min guard         General transfer comment: min/guard for safety, cues for hand placement  Ambulation/Gait Ambulation/Gait assistance: Min assist;Min guard Gait Distance (Feet): 160 Feet Assistive device: None Gait Pattern/deviations: Step-through pattern;Decreased stride length;Trunk flexed Gait velocity: decr   General Gait Details: no assistive device utilized today to challenge balance, had pt read room numbers and door signs for head turns while ambulating, slow pace   Stairs             Wheelchair Mobility    Modified Rankin (Stroke Patients Only)       Balance                                            Cognition Arousal/Alertness: Awake/alert Behavior During  Therapy: WFL for tasks assessed/performed Overall Cognitive Status: No family/caregiver present to determine baseline cognitive functioning                                 General Comments: pleasant and cooperative      Exercises General Exercises - Lower Extremity Ankle Circles/Pumps: AROM;10 reps;Seated;Both Long Arc Quad: AROM;10 reps;Seated;Both Hip ABduction/ADduction: AROM;10 reps;Standing;Both Hip Flexion/Marching: AROM;10 reps;Both;Standing Mini-Sqauts: AROM;Both;10 reps;Standing(all standing exercises performed with bil UE support)    General Comments        Pertinent Vitals/Pain Pain Assessment: No/denies pain    Home Living                      Prior Function            PT Goals (current goals can now be found in the care plan section) Progress towards PT goals: Progressing toward goals    Frequency    Min 3X/week      PT Plan Current plan remains appropriate    Co-evaluation              AM-PAC PT "6 Clicks" Mobility   Outcome Measure  Help needed turning from your back to your side while in a flat bed without using bedrails?: A Little Help needed moving from lying on your back to sitting on the side  of a flat bed without using bedrails?: A Little Help needed moving to and from a bed to a chair (including a wheelchair)?: A Little Help needed standing up from a chair using your arms (e.g., wheelchair or bedside chair)?: A Little Help needed to walk in hospital room?: A Little Help needed climbing 3-5 steps with a railing? : A Little 6 Click Score: 18    End of Session Equipment Utilized During Treatment: Gait belt Activity Tolerance: Patient tolerated treatment well Patient left: in chair;with call bell/phone within reach;with chair alarm set   PT Visit Diagnosis: Other abnormalities of gait and mobility (R26.89)     Time: 1173-5670 PT Time Calculation (min) (ACUTE ONLY): 16 min  Charges:  $Gait Training: 8-22  mins                     Carmelia Bake, PT, DPT [place] Office: [phone] Pager: [place] E 12/21/2018, 12:58 PM

## 2018-12-21 NOTE — Progress Notes (Signed)
Provided list of SNF bed offers to pt and wife. They are reviewing to make selection. [employer] authorization can be initiated once facility selected.  Sharren Bridge, MSW, LCSW Clinical Social Work 12/21/2018 [phone]

## 2018-12-21 NOTE — Progress Notes (Signed)
PROGRESS NOTE    Pedro Palmer  EKC:[id number] DOB: 11/11/1933 DOA: 12/13/2018 PCP: Seward Carol, MD   Brief Narrative:  85 year old with past medical history relevant for colon cancer status post hemicolectomy, remote history of pulmonary embolism not on anticoagulation, prostate cancer, hypertension, hyperlipidemia, Paget's disease of bone admitted with sepsis from urinary source, altered mental status.  Patient was hospitalized.  Started on IV antibiotics.  Patient started improving.  Transitioned to oral antibiotics.  Seen by physical therapy who recommends short-term rehab.   Assessment & Plan:   Acute toxic encephalopathy  Secondary to urinary tract infection.  Has improved with treatment.  Seems to be back to baseline.  Urinary tract infection with sepsis CT scan only showed distended bladder with diverticulum.  Patient was started on cefepime.  Blood cultures grew coag negative staph in 1 out of 2.  This is likely a contaminant.  Urine culture with growth of multiple species.  No previous culture data available.  Care everywhere also reviewed.  Patient was changed over to ciprofloxacin.  He has completed a 7-day course.   Coag negative staph bacteremia Only in 1 out of 2 sets.  Most likely contaminant.  Urinary retention Patient noted to have distended bladder on CT imaging and to have elevated postvoid residuals.  Flomax was initiated.  He has been able to void on own but he has to be standing up to do so.  Continue to do the same and avoid an indwelling catheter as much as possible.  Outpatient urology consultation.  Constipation Has not had a bowel movement in a few days.  MiraLAX.  AKI Resolved  Essential hypertension and dyslipidemia Patient remained poorly controlled despite lisinopril.  Dose was increased on 1/10.  Blood pressure appears to be improving slightly.  We will not make any further changes for now.  Continue statin.   Remote history of PE Currently patient  is off all anticoagulation  Remote history of colon cancer status post hemicolectomy Not an active problem  Prostate cancer See above.  Will need to follow-up with urology.  Paget's disease of bone Alk phos mildly elevated  Constipation He was started on MiraLAX yesterday.  Has not had a bowel movement yet.  Suppository to be given today.  Prophylaxis: Enoxaparin  Disposition: Skilled nursing facility recommended by physical therapy for short-term rehab.  Discussed with his wife who has had multiple surgeries recently and would also like for the patient to get some rehab.  Social worker consulted.  Full code  PT/OT evaluations performed. SNF recommended. SNF appropriate as the patient has received 3 days of hospital care and is felt to need rehab services to restore this patient to their prior level of function to achieve safe transition back to home care. This patient needs rehab services for at least 5 days per week and skilled nursing services daily to facilitate this transition. Rehab is being requested as the most appropriate d/c option for this patient and is NOT felt to be for custodial care as evidenced by independent level of functioning prior to hospitalization.     Consultants:   None  Procedures:   None  Antimicrobials:   IV cefepime started 12/13/2018  Changed over to ciprofloxacin on 1/8   Subjective: Patient continues to complain of constipation.  Has not had a bowel movement yet.  Otherwise he feels well.  Objective: Vitals:   12/20/18 1256 12/20/18 2334 12/20/18 2336 12/21/18 0606  BP: (!) 158/89  (!) 165/88 (!) 154/83  Pulse:  87  76 78  Resp: _0 Temp:  97.9 F (36.6 C)  98.7 F (37.1 C)  TempSrc:  Oral  Oral  SpO2: 99%  98% 96%  Weight:      Height:        Intake/Output Summary (Last 24 hours) at 12/21/2018 1329 Last data filed at 12/21/2018 0856 Gross per 24 hour  Intake -  Output 1300 ml  Net -1300 ml   Filed Weights   12/13/18  2107  Weight: 61.2 kg    Examination:  General appearance: Awake alert.  In no distress Resp: Clear to auscultation bilaterally.  Normal effort Cardio: S1-S2 is normal regular.  No S3-S4.  No rubs murmurs or bruit GI: Abdomen is soft.  Nontender nondistended.  Bowel sounds are present normal.  No masses organomegaly Extremities: No edema.  Full range of motion of lower extremities. Neurologic: Alert and oriented x3.  No focal neurological deficits.      Data Reviewed: I have personally reviewed following labs and imaging studies  CBC: Recent Labs  Lab 12/15/18 0315 12/16/18 0356 12/17/18 0421  WBC 21.5* 16.3* 8.1  HGB 13.0 13.4 14.0  HCT 40.1 41.2 42.4  MCV 101.0* 99.8 99.1  PLT 101* 100* 102*   Basic Metabolic Panel: Recent Labs  Lab 12/15/18 0315 12/16/18 0356 12/17/18 0421 12/19/18 0526  NA 140 141 141 141  K 4.1 3.8 3.7 4.1  CL 113* 113* 112* 111  CO2 21* 21* 21* 23  GLUCOSE 107* 132* 118* 121*  BUN _1 CREATININE 1.02 0.86 0.83 0.90  CALCIUM 8.8* 9.1 9.0 9.5  MG 2.0 1.9 1.9  --    GFR: Estimated Creatinine Clearance: 53.8 mL/min (by C-G formula based on SCr of 0.9 mg/dL).  Liver Function Tests: Recent Labs  Lab 12/15/18 0315  AST 35  ALT 32  ALKPHOS 164*  BILITOT 0.6  PROT 5.6*  ALBUMIN 3.1*   CBG: Recent Labs  Lab 12/16/18 0733  GLUCAP 119*    Recent Results (from the past 240 hour(s))  Blood Culture (routine x 2)     Status: Abnormal   Collection Time: 12/13/18  9:25 PM  Result Value Ref Range Status   Specimen Description   Final    BLOOD LEFT ANTECUBITAL Performed at [facility] [address] [place], [place] [address]    Special Requests   Final    BOTTLES DRAWN AEROBIC AND ANAEROBIC Blood Culture adequate volume Performed at [facility] [address], [place], [place] [address]    Culture  Setup Time   Final    AEROBIC BOTTLE ONLY GRAM POSITIVE COCCI Organism ID to  follow CRITICAL RESULT CALLED TO, READ BACK BY AND VERIFIED WITH: A PHAN PHARMD 0027 12/16/18 A BROWNING    Culture (A)  Final    STAPHYLOCOCCUS SPECIES (COAGULASE NEGATIVE) THE SIGNIFICANCE OF ISOLATING THIS ORGANISM FROM A SINGLE SET OF BLOOD CULTURES WHEN MULTIPLE SETS ARE DRAWN IS UNCERTAIN. PLEASE NOTIFY THE MICROBIOLOGY DEPARTMENT WITHIN ONE WEEK IF SPECIATION AND SENSITIVITIES ARE REQUIRED. Performed at [facility], [place] [address] [place], [place] [address]    Report Status 12/17/2018 FINAL  Final  Urine culture     Status: Abnormal   Collection Time: 12/13/18  9:25 PM  Result Value Ref Range Status   Specimen Description   Final    URINE, CLEAN CATCH Performed at [facility], [address] [place],  [place] [address]    Special Requests   Final    NONE Performed at [facility], [address] [place], [place] [address]    Culture MULTIPLE SPECIES PRESENT, SUGGEST RECOLLECTION (A)  Final   Report Status 12/16/2018 FINAL  Final  Blood Culture ID Panel (Reflexed)     Status: Abnormal   Collection Time: 12/13/18  9:25 PM  Result Value Ref Range Status   Enterococcus species NOT DETECTED NOT DETECTED Final   Listeria monocytogenes NOT DETECTED NOT DETECTED Final   Staphylococcus species DETECTED (A) NOT DETECTED Final    Comment: Methicillin (oxacillin) susceptible coagulase negative staphylococcus. Possible blood culture contaminant (unless isolated from more than one blood culture draw or clinical case suggests pathogenicity). No antibiotic treatment is indicated for blood  culture contaminants. CRITICAL RESULT CALLED TO, READ BACK BY AND VERIFIED WITH: A PHAN PHARMD 0027 12/16/18 A BROWNING    Staphylococcus aureus (BCID) NOT DETECTED NOT DETECTED Final   Methicillin resistance NOT DETECTED NOT DETECTED Final   Streptococcus species NOT DETECTED NOT DETECTED Final   Streptococcus agalactiae NOT DETECTED NOT DETECTED Final    Streptococcus pneumoniae NOT DETECTED NOT DETECTED Final   Streptococcus pyogenes NOT DETECTED NOT DETECTED Final   Acinetobacter baumannii NOT DETECTED NOT DETECTED Final   Enterobacteriaceae species NOT DETECTED NOT DETECTED Final   Enterobacter cloacae complex NOT DETECTED NOT DETECTED Final   Escherichia coli NOT DETECTED NOT DETECTED Final   Klebsiella oxytoca NOT DETECTED NOT DETECTED Final   Klebsiella pneumoniae NOT DETECTED NOT DETECTED Final   Proteus species NOT DETECTED NOT DETECTED Final   Serratia marcescens NOT DETECTED NOT DETECTED Final   Haemophilus influenzae NOT DETECTED NOT DETECTED Final   Neisseria meningitidis NOT DETECTED NOT DETECTED Final   Pseudomonas aeruginosa NOT DETECTED NOT DETECTED Final   Candida albicans NOT DETECTED NOT DETECTED Final   Candida glabrata NOT DETECTED NOT DETECTED Final   Candida krusei NOT DETECTED NOT DETECTED Final   Candida parapsilosis NOT DETECTED NOT DETECTED Final   Candida tropicalis NOT DETECTED NOT DETECTED Final    Comment: Performed at [facility], [address] [place], [place] [address]  Blood Culture (routine x 2)     Status: None   Collection Time: 12/13/18  9:30 PM  Result Value Ref Range Status   Specimen Description   Final    BLOOD LEFT ANTECUBITAL Performed at [facility], [address] [place], [place] [address]    Special Requests   Final    BOTTLES DRAWN AEROBIC AND ANAEROBIC Blood Culture adequate volume Performed at [facility], [address] [place], [place] [address]    Culture   Final    NO GROWTH 5 DAYS Performed at [facility], [address] [place], [place] [address]    Report Status 12/19/2018 FINAL  Final  MRSA PCR Screening     Status: None   Collection Time: 12/14/18 12:47 AM  Result Value Ref Range Status   MRSA by PCR NEGATIVE NEGATIVE Final    Comment:        The GeneXpert MRSA Assay (FDA approved for NASAL specimens only),  is one component of a comprehensive MRSA colonization surveillance program. It is not intended to diagnose MRSA infection nor to guide or monitor treatment for MRSA infections. Performed at [facility], [address] [place], [place] [address]          Radiology Studies: No   results found.   Scheduled Meds: . aspirin EC  81 mg Oral Daily  . enoxaparin (LOVENOX) injection  40 mg Subcutaneous Q24H  . lisinopril  20 mg Oral BID  . polyethylene glycol  17 g Oral Daily  . saccharomyces boulardii  250 mg Oral BID  . simvastatin  20 mg Oral QPC breakfast  . tamsulosin  0.4 mg Oral Daily   Continuous Infusions:    LOS: 8 days     , MD [facility] [email or web address]  12/21/2018, 1:29 PM   

## 2018-12-21 NOTE — Progress Notes (Signed)
Writer assumed care of this patient at 1545.  I agree with the previous nurses assessment. Will continue to monitor closely and follow plan of care.  

## 2018-12-21 NOTE — Care Management Important Message (Signed)
Important Message  Patient Details  Name: Pedro Palmer MRN: [id number] Date of Birth: [birth year]   Medicare Important Message Given:  Yes    Kerin Salen 12/21/2018, 11:59 AMImportant Message  Patient Details  Name: Pedro Palmer MRN: [id number] Date of Birth: 11/22/1933   Medicare Important Message Given:  Yes    Kerin Salen 12/21/2018, 11:59 AM

## 2018-12-22 MED ORDER — LISINOPRIL 20 MG PO TABS: 20.0000 mg | ORAL_TABLET | Freq: Two times a day (BID) | ORAL | Status: DC

## 2018-12-22 MED ORDER — POLYETHYLENE GLYCOL 3350 17 G PO PACK: 17.0000 g | PACK | Freq: Every day | ORAL | 0 refills | Status: DC

## 2018-12-22 MED ORDER — TAMSULOSIN HCL 0.4 MG PO CAPS: 0.4000 mg | ORAL_CAPSULE | Freq: Every day | ORAL | 0 refills | Status: DC

## 2018-12-22 NOTE — Discharge Summary (Signed)
[facility]  Physician Discharge Summary   Patient ID: Pedro Palmer MRN: [id number] DOB/AGE: 1933/01/23 85 y.o.  Admit date: 12/13/2018 Discharge date: 12/22/2018  PCP: Seward Carol, MD  DISCHARGE DIAGNOSES:  Urinary tract infection with sepsis, resolved Urinary retention, resolved Constipation, resolved Essential hypertension Dyslipidemia History of prostate cancer  RECOMMENDATIONS FOR OUTPATIENT FOLLOW UP: 1. Patient will need to be referred to urology once he has completed his rehab.   DISCHARGE CONDITION: fair  Diet recommendation: Heart healthy  Filed Weights   12/13/18 2107  Weight: 61.2 kg    INITIAL HISTORY: 85 year old with past medical history relevant for colon cancer status post hemicolectomy, remote history of pulmonary embolism not on anticoagulation, prostate cancer, hypertension, hyperlipidemia, Paget's disease of bone admitted with sepsis from urinary source, altered mental status.  Patient was hospitalized.  Started on IV antibiotics.  Patient started improving.  Transitioned to oral antibiotics.  Seen by physical therapy who recommends short-term rehab.   HOSPITAL COURSE:   Acute toxic encephalopathy  Secondary to urinary tract infection.  Has improved with treatment.  Seems to be back to baseline.  Urinary tract infection with sepsis CT scan showed distended bladder with diverticulum.  Patient was started on cefepime.  Urine culture with growth of multiple species.  No previous culture data available.  Care everywhere also reviewed.  Patient was changed over to ciprofloxacin.  He has completed a 7-day course.   Coag negative staph bacteremia Only in 1 out of 2 sets.  Most likely contaminant.  Urinary retention Patient noted to have distended bladder on CT imaging and to have elevated postvoid residuals.  Flomax was initiated.  He has been able to void on own but he has to be standing up to do so.  Continue to do the same and avoid an  indwelling catheter as much as possible.  Will need to be referred to urology as an outpatient.  Acute kidney injury  Secondary to sepsis and hypovolemia.  Resolved  Essential hypertension and dyslipidemia Patient remained poorly controlled despite lisinopril.  Dose was increased on 1/10.  Blood pressure has improved.  Continue current treatment for now.  Continue statin.   Remote history of PE Currently patient is off all anticoagulation  Remote history of colon cancer status post hemicolectomy Not an active problem  Prostate cancer See above.  Will need to follow-up with urology.  Paget's disease of bone Alk phos mildly elevated  Constipation Patient was started on MiraLAX.  He did have a bowel movement yesterday.  Overall stable.  Okay for discharge to skilled nursing facility.  Waiting on insurance authorization.    PERTINENT LABS:  The results of significant diagnostics from this hospitalization (including imaging, microbiology, ancillary and laboratory) are listed below for reference.    Microbiology: Recent Results (from the past 240 hour(s))  Blood Culture (routine x 2)     Status: Abnormal   Collection Time: 12/13/18  9:25 PM  Result Value Ref Range Status   Specimen Description   Final    BLOOD LEFT ANTECUBITAL Performed at [facility] [address], [place], [place] [address]    Special Requests   Final    BOTTLES DRAWN AEROBIC AND ANAEROBIC Blood Culture adequate volume Performed at [facility] [address], [place], [place] [address]    Culture  Setup Time   Final    AEROBIC BOTTLE ONLY GRAM POSITIVE COCCI Organism ID to follow CRITICAL RESULT CALLED TO, READ BACK BY AND  VERIFIED WITH: A PHAN PHARMD [id number] 12/16/18 A BROWNING    Culture (A)  Final    STAPHYLOCOCCUS SPECIES (COAGULASE NEGATIVE) THE SIGNIFICANCE OF ISOLATING THIS ORGANISM FROM A SINGLE SET OF BLOOD CULTURES WHEN MULTIPLE SETS ARE DRAWN IS  UNCERTAIN. PLEASE NOTIFY THE MICROBIOLOGY DEPARTMENT WITHIN ONE WEEK IF SPECIATION AND SENSITIVITIES ARE REQUIRED. Performed at [facility], [place] [address], [place], [place] [address]    Report Status 12/17/2018 FINAL  Final  Urine culture     Status: Abnormal   Collection Time: 12/13/18  9:25 PM  Result Value Ref Range Status   Specimen Description   Final    URINE, CLEAN CATCH Performed at [facility], [place] [address] [place], [place] [address]    Special Requests   Final    NONE Performed at [facility], [place] [address], [place], [place] [address]    Culture MULTIPLE SPECIES PRESENT, SUGGEST RECOLLECTION (A)  Final   Report Status 12/16/2018 FINAL  Final  Blood Culture ID Panel (Reflexed)     Status: Abnormal   Collection Time: 12/13/18  9:25 PM  Result Value Ref Range Status   Enterococcus species NOT DETECTED NOT DETECTED Final   Listeria monocytogenes NOT DETECTED NOT DETECTED Final   Staphylococcus species DETECTED (A) NOT DETECTED Final    Comment: Methicillin (oxacillin) susceptible coagulase negative staphylococcus. Possible blood culture contaminant (unless isolated from more than one blood culture draw or clinical case suggests pathogenicity). No antibiotic treatment is indicated for blood  culture contaminants. CRITICAL RESULT CALLED TO, READ BACK BY AND VERIFIED WITH: A PHAN PHARMD [id number] 12/16/18 A BROWNING    Staphylococcus aureus (BCID) NOT DETECTED NOT DETECTED Final   Methicillin resistance NOT DETECTED NOT DETECTED Final   Streptococcus species NOT DETECTED NOT DETECTED Final   Streptococcus agalactiae NOT DETECTED NOT DETECTED Final   Streptococcus pneumoniae NOT DETECTED NOT DETECTED Final   Streptococcus pyogenes NOT DETECTED NOT DETECTED Final   Acinetobacter baumannii NOT DETECTED NOT DETECTED Final   Enterobacteriaceae species NOT DETECTED NOT DETECTED Final   Enterobacter cloacae complex NOT DETECTED NOT DETECTED  Final   Escherichia coli NOT DETECTED NOT DETECTED Final   Klebsiella oxytoca NOT DETECTED NOT DETECTED Final   Klebsiella pneumoniae NOT DETECTED NOT DETECTED Final   Proteus species NOT DETECTED NOT DETECTED Final   Serratia marcescens NOT DETECTED NOT DETECTED Final   Haemophilus influenzae NOT DETECTED NOT DETECTED Final   Neisseria meningitidis NOT DETECTED NOT DETECTED Final   Pseudomonas aeruginosa NOT DETECTED NOT DETECTED Final   Candida albicans NOT DETECTED NOT DETECTED Final   Candida glabrata NOT DETECTED NOT DETECTED Final   Candida krusei NOT DETECTED NOT DETECTED Final   Candida parapsilosis NOT DETECTED NOT DETECTED Final   Candida tropicalis NOT DETECTED NOT DETECTED Final    Comment: Performed at [facility], [place]. [address], [place], [place] [address]  Blood Culture (routine x 2)     Status: None   Collection Time: 12/13/18  9:30 PM  Result Value Ref Range Status   Specimen Description   Final    BLOOD LEFT ANTECUBITAL Performed at [facility] [address] [place], [place] [address]    Special Requests   Final    BOTTLES DRAWN AEROBIC AND ANAEROBIC Blood Culture adequate volume Performed at [facility] [address], [place], [place] [address]    Culture   Final    NO GROWTH 5 DAYS  Performed at [facility], [place] [address], [place], [place] [address]    Report Status 12/19/2018 FINAL  Final  MRSA PCR Screening     Status: None   Collection Time: 12/14/18 12:47 AM  Result Value Ref Range Status   MRSA by PCR NEGATIVE NEGATIVE Final    Comment:        The GeneXpert MRSA Assay (FDA approved for NASAL specimens only), is one component of a comprehensive MRSA colonization surveillance program. It is not intended to diagnose MRSA infection nor to guide or monitor treatment for MRSA infections. Performed at [facility], [place] [address], [place], [place] [address]       Labs: Basic Metabolic Panel: Recent Labs  Lab 12/16/18 0356 12/17/18 0421 12/19/18 0526  NA 141 141 141  K 3.8 3.7 4.1  CL 113* 112* 111  CO2 21* 21* 23  GLUCOSE 132* 118* 121*  BUN _0 CREATININE 0.86 0.83 0.90  CALCIUM 9.1 9.0 9.5  MG 1.9 1.9  --    CBC: Recent Labs  Lab 12/16/18 0356 12/17/18 0421  WBC 16.3* 8.1  HGB 13.4 14.0  HCT 41.2 42.4  MCV 99.8 99.1  PLT 100* 119*    CBG: Recent Labs  Lab 12/16/18 0733  GLUCAP 119*     IMAGING STUDIES Ct Abdomen Pelvis Wo Contrast  Result Date: 12/14/2018 CLINICAL DATA:  Distended abdomen. History of colon cancer, prostate cancer EXAM: CT ABDOMEN AND PELVIS WITHOUT CONTRAST TECHNIQUE: Multidetector CT imaging of the abdomen and pelvis was performed following the standard protocol without IV contrast. COMPARISON:  CT abdomen 01/18/2015 FINDINGS: Lower chest: Mild bibasilar atelectasis. Negative for pneumonia or effusion. Heart size within normal limits. Hepatobiliary: No focal liver abnormality is seen. No gallstones, gallbladder wall thickening, or biliary dilatation. Pancreas: Negative Spleen: Negative Adrenals/Urinary Tract: 10 mm left upper pole renal cyst has enlarged. 3.3 mm right upper pole renal cyst has enlarged. Small complex cyst right lower pole unchanged in size. No renal stone or obstruction. Distended urinary bladder with diffuse wall thickening of the wall. 5 cm diverticulum of the bladder dome on the right. Stomach/Bowel: Negative for bowel obstruction. Enteric contrast fills bowel extending to the rectum. Appendectomy. Small hiatal hernia. Vascular/Lymphatic: Minimal atherosclerotic disease.  No adenopathy Reproductive: Moderate prostate enlargement. Periumbilical hernia on the prior study no longer visualized apparently has been repaired. Other: No free fluid Musculoskeletal: Irregular sclerotic changes in the sacrum. Given history of Paget's disease and stability, this is likely benign. Patchy lytic and  sclerotic lesions in the lumbar spine also likely Paget's disease. Negative for fracture. Moderate disc degeneration and spurring L3-4 L4-5 IMPRESSION: 1. Negative for bowel obstruction 2. Distended bladder with bladder wall thickening and bladder diverticulum. Prostate enlargement. 3. Bony changes in the lumbar and sacral spine are stable and likely benign, probable Paget's disease. Electronically Signed   By: Franchot Gallo M.D.   On: 12/14/2018 15:15   Dg Chest Port 1 View  Result Date: 12/13/2018 CLINICAL DATA:  Cough, fever and weakness EXAM: PORTABLE CHEST 1 VIEW COMPARISON:  CT chest 01/18/2015, CXR 01/05/2014 FINDINGS: AP portable semi upright view of the chest. Low lung volumes are noted. Borderline cardiomegaly with tortuous thoracic aorta. Mild central vascular congestion without acute pulmonary consolidation, effusion or pneumothorax. No acute nor aggressive osseous abnormality. IMPRESSION: Low lung volumes with borderline cardiomegaly and mild central vascular congestion. Electronically Signed   By: Ashley Royalty M.D.   On: 12/13/2018 22:25  DISCHARGE EXAMINATION: Vitals:   12/21/18 1336 12/21/18 2021 12/22/18 0529 12/22/18 1256  BP: (!) 152/93 136/78 136/82 (!) 145/91  Pulse: (!) 105 90 82 85  Resp: _0 Temp: 98.7 F (37.1 C) 98.5 F (36.9 C) 98.7 F (37.1 C) 99.3 F (37.4 C)  TempSrc: Oral Oral Oral Oral  SpO2: 96% 97% 97% 95%  Weight:      Height:       General appearance: Awake alert.  In no distress Resp: Clear to auscultation bilaterally.  Normal effort Cardio: S1-S2 is normal regular.  No S3-S4.  No rubs murmurs or bruit GI: Abdomen is soft.  Nontender nondistended.  Bowel sounds are present normal.  No masses organomegaly Extremities: No edema.  Full range of motion of lower extremities. Neurologic: Alert and oriented x3.  No focal neurological deficits.    DISPOSITION: Skilled nursing facility  Discharge Instructions    Call MD for:  difficulty  breathing, headache or visual disturbances   Complete by:  As directed    Call MD for:  extreme fatigue   Complete by:  As directed    Call MD for:  persistant dizziness or light-headedness   Complete by:  As directed    Call MD for:  persistant nausea and vomiting   Complete by:  As directed    Call MD for:  severe uncontrolled pain   Complete by:  As directed    Call MD for:  temperature >100.4   Complete by:  As directed    Discharge instructions   Complete by:  As directed    Please have your primary care provider refer you to urology for your difficulty with urinating as well as for your history of prostate cancer.  See instructions also in the discharge summary.  You were cared for by a hospitalist during your hospital stay. If you have any questions about your discharge medications or the care you received while you were in the hospital after you are discharged, you can call the unit and asked to speak with the hospitalist on call if the hospitalist that took care of you is not available. Once you are discharged, your primary care physician will handle any further medical issues. Please note that NO REFILLS for any discharge medications will be authorized once you are discharged, as it is imperative that you return to your primary care physician (or establish a relationship with a primary care physician if you do not have one) for your aftercare needs so that they can reassess your need for medications and monitor your lab values. If you do not have a primary care physician, you can call [phone] for a physician referral.   Increase activity slowly   Complete by:  As directed         Allergies as of 12/22/2018   No Known Allergies     Medication List    TAKE these medications   aspirin EC 81 MG tablet Take 81 mg by mouth daily.   lisinopril 20 MG tablet Commonly known as:  PRINIVIL,ZESTRIL Take 1 tablet (20 mg total) by mouth 2 (two) times daily. What changed:  when to take  this   multivitamin with minerals Tabs tablet Take 1 tablet by mouth daily.   polyethylene glycol packet Commonly known as:  MIRALAX / GLYCOLAX Take 17 g by mouth daily. Start taking on:  December 23, 2018   tamsulosin 0.4 MG Caps capsule Commonly known as:  FLOMAX Take 1 capsule (  0.4 mg total) by mouth daily. Start taking on:  December 23, 2018   ZOCOR 20 MG tablet Generic drug:  simvastatin Take 20 mg by mouth daily after breakfast.         Contact information for follow-up providers    Seward Carol, MD. Schedule an appointment as soon as possible for a visit in 2 week(s).   Specialty:  Internal Medicine Contact information: [address] [place] [address] [phone]            Contact information for after-discharge care    Destination    [facility] Preferred SNF .   Service:  Skilled Nursing Contact information: [address] [place] [phone]                  TOTAL DISCHARGE TIME: 49 minutes  [place]  [facility] Pager on [email or web address]  12/22/2018, 1:00 PM

## 2018-12-22 NOTE — Progress Notes (Signed)
Pt and wife have selected [facility] SNF from bed offers and facility is pursuing authorization from [facility].  Sharren Bridge, MSW, LCSW Clinical Social Work 12/22/2018 [phone]

## 2018-12-22 NOTE — Discharge Instructions (Signed)
Urinary Tract Infection, Adult A urinary tract infection (UTI) is an infection of any part of the urinary tract. The urinary tract includes:  The kidneys.  The ureters.  The bladder.  The urethra. These organs make, store, and get rid of pee (urine) in the body. What are the causes? This is caused by germs (bacteria) in your genital area. These germs grow and cause swelling (inflammation) of your urinary tract. What increases the risk? You are more likely to develop this condition if:  You have a small, thin tube (catheter) to drain pee.  You cannot control when you pee or poop (incontinence).  You are male, and: ? You use these methods to prevent pregnancy: ? A medicine that kills sperm (spermicide). ? A device that blocks sperm (diaphragm). ? You have low levels of a male hormone (estrogen). ? You are pregnant.  You have genes that add to your risk.  You are sexually active.  You take antibiotic medicines.  You have trouble peeing because of: ? A prostate that is bigger than normal, if you are male. ? A blockage in the part of your body that drains pee from the bladder (urethra). ? A kidney stone. ? A nerve condition that affects your bladder (neurogenic bladder). ? Not getting enough to drink. ? Not peeing often enough.  You have other conditions, such as: ? Diabetes. ? A weak disease-fighting system (immune system). ? Sickle cell disease. ? Gout. ? Injury of the spine. What are the signs or symptoms? Symptoms of this condition include:  Needing to pee right away (urgently).  Peeing often.  Peeing small amounts often.  Pain or burning when peeing.  Blood in the pee.  Pee that smells bad or not like normal.  Trouble peeing.  Pee that is cloudy.  Fluid coming from the vagina, if you are male.  Pain in the belly or lower back. Other symptoms include:  Throwing up (vomiting).  No urge to eat.  Feeling mixed up (confused).  Being tired  and grouchy (irritable).  A fever.  Watery poop (diarrhea). How is this treated? This condition may be treated with:  Antibiotic medicine.  Other medicines.  Drinking enough water. Follow these instructions at home:  Medicines  Take over-the-counter and prescription medicines only as told by your doctor.  If you were prescribed an antibiotic medicine, take it as told by your doctor. Do not stop taking it even if you start to feel better. General instructions  Make sure you: ? Pee until your bladder is empty. ? Do not hold pee for a long time. ? Empty your bladder after sex. ? Wipe from front to back after pooping if you are a male. Use each tissue one time when you wipe.  Drink enough fluid to keep your pee pale yellow.  Keep all follow-up visits as told by your doctor. This is important. Contact a doctor if:  You do not get better after 1-2 days.  Your symptoms go away and then come back. Get help right away if:  You have very bad back pain.  You have very bad pain in your lower belly.  You have a fever.  You are sick to your stomach (nauseous).  You are throwing up. Summary  A urinary tract infection (UTI) is an infection of any part of the urinary tract.  This condition is caused by germs in your genital area.  There are many risk factors for a UTI. These include having a small, thin   tube to drain pee and not being able to control when you pee or poop.  Treatment includes antibiotic medicines for germs.  Drink enough fluid to keep your pee pale yellow. This information is not intended to replace advice given to you by your health care provider. Make sure you discuss any questions you have with your health care provider. Document Released: 05/13/2008 Document Revised: 06/04/2018 Document Reviewed: 06/04/2018 Elsevier Interactive Patient Education  2019 Elsevier Inc.  

## 2018-12-23 DIAGNOSIS — N3001 Acute cystitis with hematuria: Secondary | ICD-10-CM

## 2018-12-23 NOTE — Progress Notes (Signed)
No acute changes patient seen and examined and in no distress getting up slowly mainly in the room still has condom catheter-both need to be addressed he needs to walk-in condom cath needs to come off prior to discharge advised awaiting facility and can discharge as early as facility is available  No charge note  Verneita Griffes, MD [facility] 11:14 AM

## 2018-12-23 NOTE — Clinical Social Work Placement (Signed)
Pt admitting to [facility] room 120B- report [phone] PTAR transportation arranged. Wife brought pt clothes she wants him to transport in if able earlier, states she will meet him at facility later. Provided DC information to facility- pt approved for admission from [place]  NOTE  Date:  12/23/2018  Patient Details  Name: Pedro Palmer MRN: [id number] Date of Birth: [birth year]  Clinical Social Work is seeking post-discharge placement for this patient at the [place] level of care (*CSW will initial, date and re-position this form in  chart as items are completed):  Yes   Patient/family provided with [place] Work Department's list of facilities offering this level of care within the geographic area requested by the patient (or if unable, by the patient's family).  Yes   Patient/family informed of their freedom to choose among providers that offer the needed level of care, that participate in Medicare, Medicaid or managed care program needed by the patient, have an available bed and are willing to accept the patient.  Yes   Patient/family informed of 's ownership interest in [facility] and [facility], as well as of the fact that they are under no obligation to receive care at these facilities.  PASRR submitted to EDS on       PASRR number received on       Existing PASRR number confirmed on 12/18/18     FL2 transmitted to all facilities in geographic area requested by pt/family on 12/18/18     FL2 transmitted to all facilities within larger geographic area on       Patient informed that his/her managed care company has contracts with or will negotiate with certain facilities, including the following:        Yes   Patient/family informed of bed offers received.  Patient chooses bed at       Physician recommends and patient chooses bed at      Patient to be transferred  to   on  .  Patient to be transferred to facility by       Patient family notified on   of transfer.  Name of family member notified:  wife Rod Holler     PHYSICIAN       Additional Comment:    _______________________________________________ Nila Nephew, LCSW 12/23/2018, 1:51 PM [phone]

## 2018-12-23 NOTE — Progress Notes (Signed)
Pt discharged to [facility] via [facility].  Pt A/O x4.  Ambulates with assistance.  All belongings sent with pt.  Packet sent with PTAR.  Danton Clap, RN

## 2019-08-27 ENCOUNTER — Emergency Department (HOSPITAL_COMMUNITY)
Admission: EM | Admit: 2019-08-27 | Discharge: 2019-08-28 | Disposition: A | Discharge status: Home / Self Care | Payer: Medicare Other | Source: Home / Self Care

## 2019-08-27 ENCOUNTER — Other Ambulatory Visit: Payer: Self-pay

## 2019-08-27 ENCOUNTER — Encounter (HOSPITAL_COMMUNITY): Payer: Self-pay | Admitting: Emergency Medicine

## 2019-08-27 DIAGNOSIS — K5900 Constipation, unspecified: Secondary | ICD-10-CM | POA: Diagnosis not present

## 2019-08-27 DIAGNOSIS — N179 Acute kidney failure, unspecified: Secondary | ICD-10-CM | POA: Diagnosis not present

## 2019-08-27 DIAGNOSIS — Z5321 Procedure and treatment not carried out due to patient leaving prior to being seen by health care provider: Secondary | ICD-10-CM | POA: Insufficient documentation

## 2019-08-27 DIAGNOSIS — R109 Unspecified abdominal pain: Secondary | ICD-10-CM | POA: Insufficient documentation

## 2019-08-27 LAB — CBC
HCT: 48 % (ref 39.0–52.0)
Hemoglobin: 16.1 g/dL (ref 13.0–17.0)
MCH: 32.7 pg (ref 26.0–34.0)
MCHC: 33.5 g/dL (ref 30.0–36.0)
MCV: 97.6 fL (ref 80.0–100.0)
Platelets: 171 10*3/uL (ref 150–400)
RBC: 4.92 MIL/uL (ref 4.22–5.81)
RDW: 14.6 % (ref 11.5–15.5)
WBC: 12.7 10*3/uL — ABNORMAL HIGH (ref 4.0–10.5)
nRBC: 0 % (ref 0.0–0.2)

## 2019-08-27 LAB — COMPREHENSIVE METABOLIC PANEL
ALT: 20 U/L (ref 0–44)
AST: 18 U/L (ref 15–41)
Albumin: 3.7 g/dL (ref 3.5–5.0)
Alkaline Phosphatase: 209 U/L — ABNORMAL HIGH (ref 38–126)
Anion gap: 15 (ref 5–15)
BUN: 63 mg/dL — ABNORMAL HIGH (ref 8–23)
CO2: 19 mmol/L — ABNORMAL LOW (ref 22–32)
Calcium: 10 mg/dL (ref 8.9–10.3)
Chloride: 100 mmol/L (ref 98–111)
Creatinine, Ser: 9.24 mg/dL — ABNORMAL HIGH (ref 0.61–1.24)
GFR calc Af Amer: 5 mL/min — ABNORMAL LOW (ref >60)
GFR calc non Af Amer: 5 mL/min — ABNORMAL LOW (ref >60)
Glucose, Bld: 120 mg/dL — ABNORMAL HIGH (ref 70–99)
Potassium: 5.3 mmol/L — ABNORMAL HIGH (ref 3.5–5.1)
Sodium: 134 mmol/L — ABNORMAL LOW (ref 135–145)
Total Bilirubin: 0.8 mg/dL (ref 0.3–1.2)
Total Protein: 6.6 g/dL (ref 6.5–8.1)

## 2019-08-27 LAB — LIPASE, BLOOD: Lipase: 19 U/L (ref 11–51)

## 2019-08-27 MED ORDER — SODIUM CHLORIDE 0.9% FLUSH: 3.0000 mL | Freq: Once | INTRAVENOUS | Status: DC

## 2019-08-27 NOTE — ED Notes (Signed)
Wife and pt updated on wait for treatment room.

## 2019-08-27 NOTE — ED Triage Notes (Signed)
Patient states onset one day ago developed LLQ and RLQ abdominal pain and rectal pain. Denies nausea, emesis, or diarrhea. States has a BM yesterday normal however painful.

## 2019-08-28 ENCOUNTER — Inpatient Hospital Stay (HOSPITAL_COMMUNITY)
Admission: EM | Admit: 2019-08-28 | Discharge: 2019-08-30 | DRG: 684 | Disposition: A | Discharge status: Home / Self Care | Payer: Medicare Other | Attending: Internal Medicine | Admitting: Internal Medicine

## 2019-08-28 ENCOUNTER — Emergency Department (HOSPITAL_COMMUNITY): Payer: Medicare Other

## 2019-08-28 ENCOUNTER — Encounter (HOSPITAL_COMMUNITY): Payer: Self-pay

## 2019-08-28 DIAGNOSIS — Z7982 Long term (current) use of aspirin: Secondary | ICD-10-CM

## 2019-08-28 DIAGNOSIS — I10 Essential (primary) hypertension: Secondary | ICD-10-CM | POA: Diagnosis not present

## 2019-08-28 DIAGNOSIS — C61 Malignant neoplasm of prostate: Secondary | ICD-10-CM | POA: Diagnosis not present

## 2019-08-28 DIAGNOSIS — Z791 Long term (current) use of non-steroidal anti-inflammatories (NSAID): Secondary | ICD-10-CM | POA: Diagnosis not present

## 2019-08-28 DIAGNOSIS — E785 Hyperlipidemia, unspecified: Secondary | ICD-10-CM | POA: Diagnosis present

## 2019-08-28 DIAGNOSIS — Z85038 Personal history of other malignant neoplasm of large intestine: Secondary | ICD-10-CM | POA: Diagnosis not present

## 2019-08-28 DIAGNOSIS — Z79899 Other long term (current) drug therapy: Secondary | ICD-10-CM

## 2019-08-28 DIAGNOSIS — N401 Enlarged prostate with lower urinary tract symptoms: Secondary | ICD-10-CM | POA: Diagnosis present

## 2019-08-28 DIAGNOSIS — Z86711 Personal history of pulmonary embolism: Secondary | ICD-10-CM | POA: Diagnosis not present

## 2019-08-28 DIAGNOSIS — Z9049 Acquired absence of other specified parts of digestive tract: Secondary | ICD-10-CM | POA: Diagnosis not present

## 2019-08-28 DIAGNOSIS — K5900 Constipation, unspecified: Secondary | ICD-10-CM | POA: Diagnosis present

## 2019-08-28 DIAGNOSIS — K5909 Other constipation: Secondary | ICD-10-CM | POA: Diagnosis present

## 2019-08-28 DIAGNOSIS — Z20828 Contact with and (suspected) exposure to other viral communicable diseases: Secondary | ICD-10-CM | POA: Diagnosis present

## 2019-08-28 DIAGNOSIS — C189 Malignant neoplasm of colon, unspecified: Secondary | ICD-10-CM | POA: Diagnosis present

## 2019-08-28 DIAGNOSIS — K649 Unspecified hemorrhoids: Secondary | ICD-10-CM | POA: Diagnosis present

## 2019-08-28 DIAGNOSIS — Z8546 Personal history of malignant neoplasm of prostate: Secondary | ICD-10-CM | POA: Diagnosis not present

## 2019-08-28 DIAGNOSIS — R338 Other retention of urine: Secondary | ICD-10-CM | POA: Diagnosis present

## 2019-08-28 DIAGNOSIS — R339 Retention of urine, unspecified: Secondary | ICD-10-CM

## 2019-08-28 DIAGNOSIS — K5904 Chronic idiopathic constipation: Secondary | ICD-10-CM

## 2019-08-28 DIAGNOSIS — M889 Osteitis deformans of unspecified bone: Secondary | ICD-10-CM | POA: Diagnosis not present

## 2019-08-28 DIAGNOSIS — E875 Hyperkalemia: Secondary | ICD-10-CM | POA: Diagnosis not present

## 2019-08-28 DIAGNOSIS — N179 Acute kidney failure, unspecified: Secondary | ICD-10-CM | POA: Diagnosis present

## 2019-08-28 DIAGNOSIS — Z8619 Personal history of other infectious and parasitic diseases: Secondary | ICD-10-CM

## 2019-08-28 LAB — COMPREHENSIVE METABOLIC PANEL
ALT: 10 U/L (ref 0–44)
AST: 40 U/L (ref 15–41)
Albumin: 3.8 g/dL (ref 3.5–5.0)
Alkaline Phosphatase: 188 U/L — ABNORMAL HIGH (ref 38–126)
Anion gap: 14 (ref 5–15)
BUN: 79 mg/dL — ABNORMAL HIGH (ref 8–23)
CO2: 21 mmol/L — ABNORMAL LOW (ref 22–32)
Calcium: 9.8 mg/dL (ref 8.9–10.3)
Chloride: 101 mmol/L (ref 98–111)
Creatinine, Ser: 9.78 mg/dL — ABNORMAL HIGH (ref 0.61–1.24)
GFR calc Af Amer: 5 mL/min — ABNORMAL LOW (ref >60)
GFR calc non Af Amer: 4 mL/min — ABNORMAL LOW (ref >60)
Glucose, Bld: 111 mg/dL — ABNORMAL HIGH (ref 70–99)
Potassium: 6.6 mmol/L (ref 3.5–5.1)
Sodium: 136 mmol/L (ref 135–145)
Total Bilirubin: 1.7 mg/dL — ABNORMAL HIGH (ref 0.3–1.2)
Total Protein: 7.2 g/dL (ref 6.5–8.1)

## 2019-08-28 LAB — URINALYSIS, ROUTINE W REFLEX MICROSCOPIC
Bilirubin Urine: NEGATIVE
Glucose, UA: NEGATIVE mg/dL
Hgb urine dipstick: NEGATIVE
Ketones, ur: NEGATIVE mg/dL
Leukocytes,Ua: NEGATIVE
Nitrite: NEGATIVE
Protein, ur: NEGATIVE mg/dL
Specific Gravity, Urine: 1.014 (ref 1.005–1.030)
pH: 7 (ref 5.0–8.0)

## 2019-08-28 LAB — DIFFERENTIAL
Abs Immature Granulocytes: 0.04 10*3/uL (ref 0.00–0.07)
Basophils Absolute: 0 10*3/uL (ref 0.0–0.1)
Basophils Relative: 0 %
Eosinophils Absolute: 0 10*3/uL (ref 0.0–0.5)
Eosinophils Relative: 0 %
Immature Granulocytes: 0 %
Lymphocytes Relative: 9 %
Lymphs Abs: 1.1 10*3/uL (ref 0.7–4.0)
Monocytes Absolute: 1.1 10*3/uL — ABNORMAL HIGH (ref 0.1–1.0)
Monocytes Relative: 9 %
Neutro Abs: 9.7 10*3/uL — ABNORMAL HIGH (ref 1.7–7.7)
Neutrophils Relative %: 82 %

## 2019-08-28 LAB — BASIC METABOLIC PANEL
Anion gap: 10 (ref 5–15)
BUN: 54 mg/dL — ABNORMAL HIGH (ref 8–23)
CO2: 22 mmol/L (ref 22–32)
Calcium: 10.4 mg/dL — ABNORMAL HIGH (ref 8.9–10.3)
Chloride: 109 mmol/L (ref 98–111)
Creatinine, Ser: 4.75 mg/dL — ABNORMAL HIGH (ref 0.61–1.24)
GFR calc Af Amer: 12 mL/min — ABNORMAL LOW (ref >60)
GFR calc non Af Amer: 10 mL/min — ABNORMAL LOW (ref >60)
Glucose, Bld: 111 mg/dL — ABNORMAL HIGH (ref 70–99)
Potassium: 4.2 mmol/L (ref 3.5–5.1)
Sodium: 141 mmol/L (ref 135–145)

## 2019-08-28 LAB — CBC
HCT: 45.6 % (ref 39.0–52.0)
Hemoglobin: 15.4 g/dL (ref 13.0–17.0)
MCH: 33 pg (ref 26.0–34.0)
MCHC: 33.8 g/dL (ref 30.0–36.0)
MCV: 97.6 fL (ref 80.0–100.0)
Platelets: 147 10*3/uL — ABNORMAL LOW (ref 150–400)
RBC: 4.67 MIL/uL (ref 4.22–5.81)
RDW: 14.6 % (ref 11.5–15.5)
WBC: 11.9 10*3/uL — ABNORMAL HIGH (ref 4.0–10.5)
nRBC: 0 % (ref 0.0–0.2)

## 2019-08-28 LAB — SARS CORONAVIRUS 2 (TAT 6-24 HRS): SARS Coronavirus 2: NEGATIVE

## 2019-08-28 MED ORDER — SODIUM CHLORIDE 0.45 % IV SOLN
INTRAVENOUS | Status: DC
  Administered 2019-08-28 – 2019-08-30 (×4): via INTRAVENOUS

## 2019-08-28 MED ORDER — HYDRALAZINE HCL 25 MG PO TABS
25.0000 mg | ORAL_TABLET | Freq: Three times a day (TID) | ORAL | Status: DC
  Administered 2019-08-28 – 2019-08-30 (×6): 25 mg via ORAL
  Filled 2019-08-28 (×6): qty 1

## 2019-08-28 MED ORDER — AMLODIPINE BESYLATE 5 MG PO TABS
5.0000 mg | ORAL_TABLET | Freq: Every day | ORAL | Status: DC
  Administered 2019-08-28: 5 mg via ORAL
  Filled 2019-08-28: qty 1

## 2019-08-28 MED ORDER — ASPIRIN EC 81 MG PO TBEC
81.0000 mg | DELAYED_RELEASE_TABLET | Freq: Every day | ORAL | Status: DC
  Administered 2019-08-28 – 2019-08-30 (×3): 81 mg via ORAL
  Filled 2019-08-28 (×3): qty 1

## 2019-08-28 MED ORDER — BISACODYL 5 MG PO TBEC
5.0000 mg | DELAYED_RELEASE_TABLET | Freq: Every day | ORAL | Status: DC
  Administered 2019-08-29 – 2019-08-30 (×2): 5 mg via ORAL
  Filled 2019-08-28 (×2): qty 1

## 2019-08-28 MED ORDER — POLYETHYLENE GLYCOL 3350 17 G PO PACK: 17.0000 g | PACK | Freq: Every day | ORAL | Status: DC | PRN

## 2019-08-28 MED ORDER — TAMSULOSIN HCL 0.4 MG PO CAPS
0.4000 mg | ORAL_CAPSULE | Freq: Every day | ORAL | Status: DC
  Administered 2019-08-28 – 2019-08-30 (×3): 0.4 mg via ORAL
  Filled 2019-08-28 (×3): qty 1

## 2019-08-28 MED ORDER — HYDRALAZINE HCL 20 MG/ML IJ SOLN: 10.0000 mg | Freq: Three times a day (TID) | INTRAMUSCULAR | Status: DC | PRN

## 2019-08-28 MED ORDER — SODIUM ZIRCONIUM CYCLOSILICATE 10 G PO PACK
10.0000 g | PACK | Freq: Two times a day (BID) | ORAL | Status: DC
  Administered 2019-08-29: 11:00:00 10 g via ORAL
  Filled 2019-08-28 (×4): qty 1

## 2019-08-28 MED ORDER — DOCUSATE SODIUM 100 MG PO CAPS
100.0000 mg | ORAL_CAPSULE | Freq: Two times a day (BID) | ORAL | Status: DC
  Administered 2019-08-28 – 2019-08-30 (×4): 100 mg via ORAL
  Filled 2019-08-28 (×4): qty 1

## 2019-08-28 MED ORDER — SODIUM BICARBONATE 8.4 % IV SOLN
50.0000 meq | Freq: Once | INTRAVENOUS | Status: AC
  Administered 2019-08-28: 50 meq via INTRAVENOUS
  Filled 2019-08-28: qty 50

## 2019-08-28 MED ORDER — AMLODIPINE BESYLATE 10 MG PO TABS
10.0000 mg | ORAL_TABLET | Freq: Every day | ORAL | Status: DC
  Administered 2019-08-29 – 2019-08-30 (×2): 10 mg via ORAL
  Filled 2019-08-28 (×2): qty 1

## 2019-08-28 MED ORDER — VITAMIN C 500 MG PO TABS
250.0000 mg | ORAL_TABLET | Freq: Every day | ORAL | Status: DC
  Administered 2019-08-28 – 2019-08-30 (×3): 250 mg via ORAL
  Filled 2019-08-28 (×3): qty 1

## 2019-08-28 MED ORDER — SODIUM POLYSTYRENE SULFONATE 15 GM/60ML PO SUSP
45.0000 g | Freq: Once | ORAL | Status: AC
  Administered 2019-08-28: 09:00:00 45 g via ORAL
  Filled 2019-08-28: qty 180

## 2019-08-28 MED ORDER — HEPARIN SODIUM (PORCINE) 5000 UNIT/ML IJ SOLN
5000.0000 [IU] | Freq: Three times a day (TID) | INTRAMUSCULAR | Status: DC
  Administered 2019-08-28 – 2019-08-30 (×6): 5000 [IU] via SUBCUTANEOUS
  Filled 2019-08-28 (×8): qty 1

## 2019-08-28 MED ORDER — SIMVASTATIN 20 MG PO TABS
20.0000 mg | ORAL_TABLET | Freq: Every evening | ORAL | Status: DC
  Administered 2019-08-28 – 2019-08-29 (×2): 20 mg via ORAL
  Filled 2019-08-28 (×3): qty 1

## 2019-08-28 MED ORDER — HYDROCORTISONE ACETATE 25 MG RE SUPP
25.0000 mg | Freq: Two times a day (BID) | RECTAL | Status: AC
  Administered 2019-08-28: 25 mg via RECTAL
  Filled 2019-08-28 (×3): qty 1

## 2019-08-28 MED ORDER — ADULT MULTIVITAMIN W/MINERALS CH
1.0000 | ORAL_TABLET | Freq: Every day | ORAL | Status: DC
  Administered 2019-08-28 – 2019-08-30 (×3): 1 via ORAL
  Filled 2019-08-28 (×3): qty 1

## 2019-08-28 MED ORDER — SODIUM ZIRCONIUM CYCLOSILICATE 10 G PO PACK
10.0000 g | PACK | Freq: Once | ORAL | Status: AC
  Administered 2019-08-28: 10 g via ORAL
  Filled 2019-08-28: qty 1

## 2019-08-28 MED ORDER — CALCIUM CHLORIDE 10 % IV SOLN
1.0000 g | Freq: Once | INTRAVENOUS | Status: AC
  Administered 2019-08-28: 1 g via INTRAVENOUS
  Filled 2019-08-28: qty 10

## 2019-08-28 MED ORDER — SODIUM CHLORIDE 0.45 % IV SOLN
Freq: Once | INTRAVENOUS | Status: AC
  Administered 2019-08-28: 12:00:00 via INTRAVENOUS

## 2019-08-28 MED ORDER — SODIUM CHLORIDE 0.9 % IV BOLUS
1000.0000 mL | Freq: Once | INTRAVENOUS | Status: AC
  Administered 2019-08-28: 06:00:00 1000 mL via INTRAVENOUS

## 2019-08-28 NOTE — Progress Notes (Signed)
85 yo male with hypertension, hyperlipidemia, pagets disease, h/o PE, apparently presents w left sided abdominal pain, and rectal pain  Bun/ Creatinine  63/9.24 CT renal stone study pending  Please admit for ARF

## 2019-08-28 NOTE — H&P (Signed)
.  History and Physical    [name] VQM:[id number] DOB: 1933/01/02 DOA: 08/28/2019  PCP: Seward Carol, MD  Patient coming from: Home   Chief Complaint: Constipation  HPI: Pedro Palmer is a 85 y.o. male with medical history significant of prostate CA, colon CA, paget's, chronic constipation. Presents with c/o constipation. Says that he hasn't been able to have a BM for a week. He denies any other complaints. After speaking with his wife, she reports that he doesn't like to talk or complain. She reports that he has indeed had problems with constipation and hemorrhoids that have worsened over the last week. She's tried to give him his miralax at home but that hasn't helped. She doesn't endorse any aggravating factors. She notes that's he's been progressively less energetic over the last couple day to the point that he didn't really want to go outside yesterday. That concerned her cause that is out of his normal behavior. So she brought him to the ED. She denies that he had any complaints of difficulty urinating.     ED Course: Eval'd by staff. Found to be in renal failure. Foley placed. TRH called for admission.   Review of Systems: Reports constipation. Family reports decreased appetite and energy. Denies difficulty urinating, CP, dyspnea, HA, N, V. Remainder of 10 point review of systems is otherwise negative for all not mentioned in HPI.    Past Medical History:  Diagnosis Date   Arthritis    Colon cancer ([place]) dx'd 04/2010   Hypercholesteremia    Hypertension    Paget disease of bone    Pulmonary emboli ([place]) 2011 or 2012    Past Surgical History:  Procedure Laterality Date   APPENDECTOMY     COLONOSCOPY WITH PROPOFOL N/A 07/25/2014   Procedure: COLONOSCOPY WITH PROPOFOL;  Surgeon: Garlan Fair, MD;  Location: [facility];  Service: Endoscopy;  Laterality: N/A;   HEMORRHOID SURGERY  1994   KELOID EXCISION     LEFT COLECTOMY  07/03/2010     reports that he has  never smoked. He has never used smokeless tobacco. He reports that he does not drink alcohol or use drugs.  No Known Allergies  History reviewed. No pertinent family history.  Prior to Admission medications   Medication Sig Start Date End Date Taking? Authorizing Provider  amLODipine (NORVASC) 5 MG tablet Take 5 mg by mouth daily. 07/30/19  Yes [provider]  Ascorbic Acid (VITAMIN C PO) Take 1 tablet by mouth daily.   Yes [provider]  aspirin EC 81 MG tablet Take 81 mg by mouth daily.   Yes [provider]  bisacodyl (DULCOLAX) 5 MG EC tablet Take 5 mg by mouth daily.   Yes [provider]  lisinopril (PRINIVIL,ZESTRIL) 20 MG tablet Take 1 tablet (20 mg total) by mouth 2 (two) times daily. 12/22/18  Yes Bonnielee Haff, MD  meloxicam (MOBIC) 15 MG tablet Take 15 mg by mouth daily as needed for pain.  07/04/19  Yes [provider]  Multiple Vitamin (MULTIVITAMIN WITH MINERALS) TABS tablet Take 1 tablet by mouth daily.   Yes [provider]  polyethylene glycol (MIRALAX / GLYCOLAX) packet Take 17 g by mouth daily. Patient taking differently: Take 17 g by mouth daily as needed for moderate constipation.  12/23/18  Yes Bonnielee Haff, MD  simvastatin (ZOCOR) 20 MG tablet Take 20 mg by mouth every evening.    Yes [provider]  tamsulosin (FLOMAX) 0.4 MG CAPS capsule Take 1  capsule (0.4 mg total) by mouth daily. 12/23/18  Yes Bonnielee Haff, MD    Physical Exam: Vitals:   08/28/19 0700 08/28/19 0701 08/28/19 0730 08/28/19 0800  BP: 130/80 130/80 (!) 144/87 (!) 149/90  Pulse: 96 98 70 79  Resp: (!) _0 Temp:      TempSrc:      SpO2: 93% 93% 93% 94%  Weight:      Height:        Constitutional: 85 y.o. male NAD, calm, comfortable Vitals:   08/28/19 0700 08/28/19 0701 08/28/19 0730 08/28/19 0800  BP: 130/80 130/80 (!) 144/87 (!) 149/90  Pulse: 96 98 70 79  Resp: (!) _1 Temp:      TempSrc:        SpO2: 93% 93% 93% 94%  Weight:      Height:       General: 85 y.o. male resting in bed in NAD Eyes: PERRL, normal sclera ENMT: Nares patent w/o discharge, orophaynx clear, dentition normal, ears w/o discharge/lesions/ulcers Cardiovascular: RRR, +S1, S2, no m/g/r, equal pulses throughout Respiratory: CTABL, no w/r/r, normal WOB GI: BS+, NDNT, no masses noted, no organomegaly noted MSK: No e/c/c Skin: No rashes, bruises, ulcerations noted Neuro: A&O x 3, no focal deficits Psyc: Appropriate interaction and affect, calm/cooperative   Labs on Admission: I have personally reviewed following labs and imaging studies  CBC: Recent Labs  Lab 08/27/19 1821 08/28/19 0637  WBC 12.7* 11.9*  NEUTROABS  --  9.7*  HGB 16.1 15.4  HCT 48.0 45.6  MCV 97.6 97.6  PLT 171 053*   Basic Metabolic Panel: Recent Labs  Lab 08/27/19 1821 08/28/19 0538  NA 134* 136  K 5.3* 6.6*  CL 100 101  CO2 19* 21*  GLUCOSE 120* 111*  BUN 63* 79*  CREATININE 9.24* 9.78*  CALCIUM 10.0 9.8   GFR: Estimated Creatinine Clearance: 5.3 mL/min (A) (by C-G formula based on SCr of 9.78 mg/dL (H)). Liver Function Tests: Recent Labs  Lab 08/27/19 1821 08/28/19 0538  AST 18 40  ALT 20 10  ALKPHOS 209* 188*  BILITOT 0.8 1.7*  PROT 6.6 7.2  ALBUMIN 3.7 3.8   Recent Labs  Lab 08/27/19 1821  LIPASE 19   No results for input(s): AMMONIA in the last 168 hours. Coagulation Profile: No results for input(s): INR, PROTIME in the last 168 hours. Cardiac Enzymes: No results for input(s): CKTOTAL, CKMB, CKMBINDEX, TROPONINI in the last 168 hours. BNP (last 3 results) No results for input(s): PROBNP in the last 8760 hours. HbA1C: No results for input(s): HGBA1C in the last 72 hours. CBG: No results for input(s): GLUCAP in the last 168 hours. Lipid Profile: No results for input(s): CHOL, HDL, LDLCALC, TRIG, CHOLHDL, LDLDIRECT in the last 72 hours. Thyroid Function Tests: No results for input(s): TSH,  T4TOTAL, FREET4, T3FREE, THYROIDAB in the last 72 hours. Anemia Panel: No results for input(s): VITAMINB12, FOLATE, FERRITIN, TIBC, IRON, RETICCTPCT in the last 72 hours. Urine analysis:    Component Value Date/Time   COLORURINE YELLOW 08/28/2019 0522   APPEARANCEUR CLEAR 08/28/2019 0522   LABSPEC 1.014 08/28/2019 0522   PHURINE 7.0 08/28/2019 0522   GLUCOSEU NEGATIVE 08/28/2019 0522   HGBUR NEGATIVE 08/28/2019 0522   BILIRUBINUR NEGATIVE 08/28/2019 0522   KETONESUR NEGATIVE 08/28/2019 0522   PROTEINUR NEGATIVE 08/28/2019 0522   NITRITE NEGATIVE 08/28/2019 0522   LEUKOCYTESUR NEGATIVE 08/28/2019 0522    Radiological Exams on Admission: Ct Renal Pedro Study  Result Date: 08/28/2019 CLINICAL DATA:  Left-sided abdominal pain as well as rectal pain. History of hemorrhoids. EXAM: CT ABDOMEN AND PELVIS WITHOUT CONTRAST TECHNIQUE: Multidetector CT imaging of the abdomen and pelvis was performed following the standard protocol without IV contrast. COMPARISON:  12/14/2018 FINDINGS: Lower chest: No acute abnormality. Hepatobiliary: No focal liver abnormality is seen. No gallstones, gallbladder wall thickening, or biliary dilatation. Pancreas: Unremarkable. No pancreatic ductal dilatation or surrounding inflammatory changes. Spleen: Normal in size without focal abnormality. Adrenals/Urinary Tract: No adrenal masses. Kidneys normal in position. 10 cm cyst, posterior mid to upper pole of the left kidney. Smaller low-density mass, left kidney lower pole, 2.3 cm, also likely a cyst. Three probable cysts the right, 2 medial midpole, larger mass in the upper pole, 3.5 cm. Near equal attenuation exophytic homogeneous mass, midpole right kidney measuring 14 mm likely mildly complicated cysts. These masses are stable from the prior exam. No renal stones. No hydronephrosis. Ureters are normal in course and in caliber. No ureteral stones. Bladder is decompressed with a Foley catheter. Stomach/Bowel: Stool moderately  distends the rectum. No rectal wall thickening or adjacent inflammation. Stomach is unremarkable. Small bowel and colon are normal in caliber. No wall thickening. No inflammation. Mild increased stool burden in the colon most evident in right colon. Vascular/Lymphatic: Aortic atherosclerosis. No enlarged lymph nodes. Reproductive: Unremarkable. Other: No abdominal wall hernia.  No ascites. Musculoskeletal: Mixed sclerosis and lucency noted in the sacrum consistent with Paget's disease, stable when compared to the prior CT. There are multiple small lucencies noted in lumbar vertebra and in the pelvis that are stable from the prior CT, but nonspecific. Consider possible lytic lesion such as multiple myeloma in the differential. IMPRESSION: 1. No acute findings. No evidence of bowel obstruction or inflammation. 2. Rectum moderately distended with stool. Mild generalized increased stool burden in the colon. 3. No evidence of renal or ureteral stones or obstructive uropathy. 4. Multiple small lucent bone lesions in the pelvis and lumbar spine. Consider the possibility of multiple myeloma. Changes of the sacrum consistent Paget's disease. These findings are stable prior CT. 5. Bilateral renal cysts. 6. Aortic atherosclerosis. Electronically Signed   By: Lajean Manes M.D.   On: 08/28/2019 06:28    EKG: Independently reviewed.  Assessment/Plan Principal Problem:   ARF (acute renal failure) (HCC) Active Problems:   Colon cancer (HCC)   HTN (hypertension)   Paget disease of bone   Prostate cancer (HCC)   Constipation   Hyperkalemia   ARF Hyperkalemia Hx of prostate CA     - apparently he had some obstruction, after foley placement, he diuresed quite well     - no hydronephrosis seen on CT     - maintain foley for now     - continue flomax     - consulted nephrology: schedule lokelma BID, OT dose of kayexelate, renal diet     - hold lisinopril     - will speak with urology about follow up; will likely  need to go home with foley     - inpt, med-tele  Hx of Paget's Hx of Prostate CA Hx of Colon CA     - has "Multiple small lucent bone lesions in the pelvis and lumbar spine." Per report, these findings are stable from previous CT. Continue outpt follow up.  HTN     - continue home amlodipine; hold home lisinopril     - can add hydralazine, if need be  Constipation Hemorrhoids      -  continue biscodyl, miralax     - anusol     - PRN enema  DVT prophylaxis: heparin  Code Status: FULL  Family Communication: With spouse by phone  Disposition Plan: TBD  Consults called: Neprhology  Admission status: Inpatient.    Jonnie Finner DO [facility] Pager [phone]  If 7PM-7AM, please contact night-coverage [email or web address] Password [facility]  08/28/2019, 10:36 AM

## 2019-08-28 NOTE — ED Triage Notes (Signed)
Pt presents with L sided abdominal pain and rectal pain. Hx of hemorrhoids. Pt had lab work earlier today in the [facility] ED and his creatinine resulted >9. He left d/t wait time to come here. No N/V/D.

## 2019-08-28 NOTE — ED Notes (Signed)
Pt left due to wait.  Wife said she is taking him to [facility].

## 2019-08-28 NOTE — ED Notes (Signed)
Date and time results received: 08/28/19  0620 (use smartphrase ".now" to insert current time)  Test: K+ Critical Value: 6.6  Name of Provider Notified: Rancour  Orders Received? Or Actions Taken?:

## 2019-08-28 NOTE — ED Notes (Signed)
He remains comfortable, awake alert and quite pleasant. He tells me he feels "pretty good".

## 2019-08-28 NOTE — ED Notes (Signed)
ED TO INPATIENT HANDOFF REPORT  Name/Age/Gender Pedro Palmer 85 y.o. male  Code Status    Code Status Orders  (From admission, onward)         Start     Ordered   08/28/19 1057  Full code  Continuous     08/28/19 1056        Code Status History    Date Active Date Inactive Code Status Order ID Comments User Context   12/13/2018 2329 12/23/2018 1812 Full Code [id number]  Colbert Ewing, MD ED   Advance Care Planning Activity      Home/SNF/Other Home  Chief Complaint hemorrhoids-dehydration  Level of Care/Admitting Diagnosis ED Disposition    ED Disposition Condition [place] [[id number]]  Level of Care: Telemetry [5]  Admit to tele based on following criteria: Other see comments  Comments: hyperkalemia  Covid Evaluation: Asymptomatic Screening Protocol (No Symptoms)  Diagnosis: ARF (acute renal failure) [facility]) [[id number]]  Admitting Physician: Jonnie Finner [[id number]]  Attending Physician: Jonnie Finner [[id number]]  Estimated length of stay: past midnight tomorrow  Certification:: I certify this patient will need inpatient services for at least 2 midnights  PT Class (Do Not Modify): Inpatient [101]  PT Acc Code (Do Not Modify): Private [1]       Medical History Past Medical History:  Diagnosis Date  . Arthritis   . Colon cancer ([place]) dx'd 04/2010  . Hypercholesteremia   . Hypertension   . Paget disease of bone   . Pulmonary emboli ([place]) 2011 or 2012    Allergies No Known Allergies  IV Location/Drains/Wounds Patient Lines/Drains/Airways Status   Active Line/Drains/Airways    Name:   Placement date:   Placement time:   Site:   Days:   Peripheral IV 08/28/19 Left Antecubital   08/28/19    0536    Antecubital   less than 1   Urethral Catheter Cammy Copa Straight-tip 16 Fr.   08/28/19    0527    Straight-tip   less than 1          Labs/Imaging Results for orders placed or performed during the  hospital encounter of 08/28/19 (from the past 48 hour(s))  Urinalysis, Routine w reflex microscopic     Status: None   Collection Time: 08/28/19  5:22 AM  Result Value Ref Range   Color, Urine YELLOW YELLOW   APPearance CLEAR CLEAR   Specific Gravity, Urine 1.014 1.005 - 1.030   pH 7.0 5.0 - 8.0   Glucose, UA NEGATIVE NEGATIVE mg/dL   Hgb urine dipstick NEGATIVE NEGATIVE   Bilirubin Urine NEGATIVE NEGATIVE   Ketones, ur NEGATIVE NEGATIVE mg/dL   Protein, ur NEGATIVE NEGATIVE mg/dL   Nitrite NEGATIVE NEGATIVE   Leukocytes,Ua NEGATIVE NEGATIVE    Comment: Performed at [facility], [place] [address], [place], [place] [address]  Comprehensive metabolic panel     Status: Abnormal   Collection Time: 08/28/19  5:38 AM  Result Value Ref Range   Sodium 136 135 - 145 mmol/L   Potassium 6.6 (HH) 3.5 - 5.1 mmol/L    Comment: CRITICAL RESULT CALLED TO, READ BACK BY AND VERIFIED WITH: T DOWD,RN 08/28/19 0614 RHOLMES    Chloride 101 98 - 111 mmol/L   CO2 21 (L) 22 - 32 mmol/L   Glucose, Bld 111 (H) 70 - 99 mg/dL   BUN 79 (H) 8 - 23 mg/dL   Creatinine, Ser 9.78 (H) 0.61 -  1.24 mg/dL   Calcium 9.8 8.9 - 10.3 mg/dL   Total Protein 7.2 6.5 - 8.1 g/dL   Albumin 3.8 3.5 - 5.0 g/dL   AST 40 15 - 41 U/L   ALT 10 0 - 44 U/L   Alkaline Phosphatase 188 (H) 38 - 126 U/L   Total Bilirubin 1.7 (H) 0.3 - 1.2 mg/dL   GFR calc non Af Amer 4 (L) >60 mL/min   GFR calc Af Amer 5 (L) >60 mL/min   Anion gap 14 5 - 15    Comment: Performed at [facility], [place] [address] [place], [place] [address]  CBC     Status: Abnormal   Collection Time: 08/28/19  6:37 AM  Result Value Ref Range   WBC 11.9 (H) 4.0 - 10.5 K/uL   RBC 4.67 4.22 - 5.81 MIL/uL   Hemoglobin 15.4 13.0 - 17.0 g/dL   HCT 45.6 39.0 - 52.0 %   MCV 97.6 80.0 - 100.0 fL   MCH 33.0 26.0 - 34.0 pg   MCHC 33.8 30.0 - 36.0 g/dL   RDW 14.6 11.5 - 15.5 %   Platelets 147 (L) 150 - 400 K/uL   nRBC 0.0 0.0 - 0.2 %     Comment: Performed at [facility], [place] [address] [place], [place] [address]  Differential     Status: Abnormal   Collection Time: 08/28/19  6:37 AM  Result Value Ref Range   Neutrophils Relative % 82 %   Neutro Abs 9.7 (H) 1.7 - 7.7 K/uL   Lymphocytes Relative 9 %   Lymphs Abs 1.1 0.7 - 4.0 K/uL   Monocytes Relative 9 %   Monocytes Absolute 1.1 (H) 0.1 - 1.0 K/uL   Eosinophils Relative 0 %   Eosinophils Absolute 0.0 0.0 - 0.5 K/uL   Basophils Relative 0 %   Basophils Absolute 0.0 0.0 - 0.1 K/uL   Immature Granulocytes 0 %   Abs Immature Granulocytes 0.04 0.00 - 0.07 K/uL    Comment: Performed at [facility], [place] [address] [place], [place] [address]  Basic metabolic panel     Status: Abnormal   Collection Time: 08/28/19 10:00 AM  Result Value Ref Range   Sodium 141 135 - 145 mmol/L   Potassium 4.2 3.5 - 5.1 mmol/L    Comment: DELTA CHECK NOTED   Chloride 109 98 - 111 mmol/L   CO2 22 22 - 32 mmol/L   Glucose, Bld 111 (H) 70 - 99 mg/dL   BUN 54 (H) 8 - 23 mg/dL   Creatinine, Ser 4.75 (H) 0.61 - 1.24 mg/dL    Comment: DELTA CHECK NOTED   Calcium 10.4 (H) 8.9 - 10.3 mg/dL   GFR calc non Af Amer 10 (L) >60 mL/min   GFR calc Af Amer 12 (L) >60 mL/min   Anion gap 10 5 - 15    Comment: Performed at [facility], [place] [address], [place], [place] [address]   Ct Renal Stone Study  Result Date: 08/28/2019 CLINICAL DATA:  Left-sided abdominal pain as well as rectal pain. History of hemorrhoids. EXAM: CT ABDOMEN AND PELVIS WITHOUT CONTRAST TECHNIQUE: Multidetector CT imaging of the abdomen and pelvis was performed following the standard protocol without IV contrast. COMPARISON:  12/14/2018 FINDINGS: Lower chest: No acute abnormality. Hepatobiliary: No focal liver abnormality is seen. No gallstones, gallbladder wall thickening, or biliary dilatation. Pancreas: Unremarkable. No pancreatic ductal dilatation or surrounding inflammatory  changes. Spleen: Normal  in size without focal abnormality. Adrenals/Urinary Tract: No adrenal masses. Kidneys normal in position. 10 cm cyst, posterior mid to upper pole of the left kidney. Smaller low-density mass, left kidney lower pole, 2.3 cm, also likely a cyst. Three probable cysts the right, 2 medial midpole, larger mass in the upper pole, 3.5 cm. Near equal attenuation exophytic homogeneous mass, midpole right kidney measuring 14 mm likely mildly complicated cysts. These masses are stable from the prior exam. No renal stones. No hydronephrosis. Ureters are normal in course and in caliber. No ureteral stones. Bladder is decompressed with a Foley catheter. Stomach/Bowel: Stool moderately distends the rectum. No rectal wall thickening or adjacent inflammation. Stomach is unremarkable. Small bowel and colon are normal in caliber. No wall thickening. No inflammation. Mild increased stool burden in the colon most evident in right colon. Vascular/Lymphatic: Aortic atherosclerosis. No enlarged lymph nodes. Reproductive: Unremarkable. Other: No abdominal wall hernia.  No ascites. Musculoskeletal: Mixed sclerosis and lucency noted in the sacrum consistent with Paget's disease, stable when compared to the prior CT. There are multiple small lucencies noted in lumbar vertebra and in the pelvis that are stable from the prior CT, but nonspecific. Consider possible lytic lesion such as multiple myeloma in the differential. IMPRESSION: 1. No acute findings. No evidence of bowel obstruction or inflammation. 2. Rectum moderately distended with stool. Mild generalized increased stool burden in the colon. 3. No evidence of renal or ureteral stones or obstructive uropathy. 4. Multiple small lucent bone lesions in the pelvis and lumbar spine. Consider the possibility of multiple myeloma. Changes of the sacrum consistent Paget's disease. These findings are stable prior CT. 5. Bilateral renal cysts. 6. Aortic atherosclerosis.  Electronically Signed   By: Lajean Manes M.D.   On: 08/28/2019 06:28    Pending Labs Unresulted Labs (From admission, onward)    Start     Ordered   08/29/19 0500  CBC  Tomorrow morning,   R     08/28/19 1056   08/28/19 1057  CBC  (heparin)  Once,   STAT    Comments: Baseline for heparin therapy IF NOT ALREADY DRAWN.  Notify MD if PLT < 100 K.    08/28/19 1056   08/28/19 0612  SARS CORONAVIRUS 2 (TAT 6-24 HRS) Nasopharyngeal Nasopharyngeal Swab  (Asymptomatic/Tier 2 Patients Labs)  Once,   STAT    Question Answer Comment  Is this test for diagnosis or screening Screening   Symptomatic for COVID-19 as defined by CDC No   Hospitalized for COVID-19 No   Admitted to ICU for COVID-19 No   Previously tested for COVID-19 No   Resident in a congregate (group) care setting No   Employed in healthcare setting No      08/28/19 0612   08/28/19 0512  Urine Culture  Once,   STAT     08/28/19 0511   08/28/19 0458  CBC with Differential/Platelet  Once,   STAT     08/28/19 0458          Vitals/Pain Today's Vitals   08/28/19 1130 08/28/19 1145 08/28/19 1200 08/28/19 1230  BP: (!) 145/71  (!) 165/87 (!) 174/83  Pulse: 86 91 99 83  Resp: 14 (!) 21 16 (!) 25  Temp:      TempSrc:      SpO2: 97% 96% 96% 95%  Weight:      Height:      PainSc:        Isolation Precautions No active isolations  Medications  Medications  aspirin EC tablet 81 mg (81 mg Oral Given 08/28/19 1207)  amLODipine (NORVASC) tablet 5 mg (5 mg Oral Given 08/28/19 1208)  simvastatin (ZOCOR) tablet 20 mg (has no administration in time range)  bisacodyl (DULCOLAX) EC tablet 5 mg (5 mg Oral Not Given 08/28/19 1208)  polyethylene glycol (MIRALAX / GLYCOLAX) packet 17 g (has no administration in time range)  tamsulosin (FLOMAX) capsule 0.4 mg (0.4 mg Oral Given 08/28/19 1207)  vitamin C (ASCORBIC ACID) tablet 250 mg (250 mg Oral Given 08/28/19 1207)  multivitamin with minerals tablet 1 tablet (1 tablet Oral Given 08/28/19  1207)  heparin injection 5,000 Units (5,000 Units Subcutaneous Given 08/28/19 1206)  docusate sodium (COLACE) capsule 100 mg (100 mg Oral Not Given 08/28/19 1208)  sodium zirconium cyclosilicate (LOKELMA) packet 10 g (has no administration in time range)  hydrocortisone (ANUSOL-HC) suppository 25 mg (has no administration in time range)  sodium chloride 0.9 % bolus 1,000 mL (0 mLs Intravenous Stopped 08/28/19 0802)  sodium bicarbonate injection 50 mEq (50 mEq Intravenous Given 08/28/19 0638)  sodium zirconium cyclosilicate (LOKELMA) packet 10 g (10 g Oral Given 08/28/19 0638)  calcium chloride injection 1 g (1 g Intravenous Given 08/28/19 0802)  sodium polystyrene (KAYEXALATE) 15 GM/60ML suspension 45 g (45 g Oral Given 08/28/19 0910)  0.45 % sodium chloride infusion ( Intravenous New Bag/Given 08/28/19 1136)    Mobility walks

## 2019-08-28 NOTE — ED Provider Notes (Signed)
[facility] DEPT Provider Note   CSN: [id number] Arrival date & time: 08/28/19  0422     History   Chief Complaint Chief Complaint  Patient presents with   Dehydration    HPI Pedro Palmer is a 85 y.o. male.     Patient with history of colon cancer, hypertension, BPH, PE not on anticoagulation presenting with a 2-day history of lower abdominal pain, rectal pain that he thinks is due to "hemorrhoids.".  States he developed lower abdominal pain and rectal pain about a "a day and a half ago".  He denies any vomiting, chills, fever, chest pain or shortness of breath.  States his been urinating normally and defecating normally.  Denies any blood in the stool.  He presented to [facility] at about 6 PM on September 18 and waited till 4 AM before leaving and coming here.  Labs at [facility] show acute renal failure with creatinine greater than 9.  Creatinine was normal on labs 8 months ago.  He denies any leg pain or leg swelling.  Did have a bowel movement yesterday that was painful.  States it was nonbloody.  The history is provided by the patient and a relative.    Past Medical History:  Diagnosis Date   Arthritis    Colon cancer ([place]) dx'd 04/2010   Hypercholesteremia    Hypertension    Paget disease of bone    Pulmonary emboli ([employer]) 2011 or 2012    Patient Active Problem List   Diagnosis Date Noted   Sepsis ([place]) 12/13/2018   Paget disease of bone    Colon cancer ([place]) 07/21/2012   Hypertension 07/21/2012    Past Surgical History:  Procedure Laterality Date   APPENDECTOMY     COLONOSCOPY WITH PROPOFOL N/A 07/25/2014   Procedure: COLONOSCOPY WITH PROPOFOL;  Surgeon: Garlan Fair, MD;  Location: [facility] ENDOSCOPY;  Service: Endoscopy;  Laterality: N/A;   [place] EXCISION     LEFT COLECTOMY  07/03/2010        Home Medications    Prior to Admission medications   Medication Sig Start Date End Date  Taking? Authorizing Provider  amLODipine (NORVASC) 5 MG tablet Take 5 mg by mouth daily. 07/30/19   [provider]  aspirin EC 81 MG tablet Take 81 mg by mouth daily.    [provider]  lisinopril (PRINIVIL,ZESTRIL) 20 MG tablet Take 1 tablet (20 mg total) by mouth 2 (two) times daily. 12/22/18   Bonnielee Haff, MD  meloxicam (MOBIC) 15 MG tablet Take 15 mg by mouth daily. 07/04/19   [provider]  Multiple Vitamin (MULTIVITAMIN WITH MINERALS) TABS tablet Take 1 tablet by mouth daily.    [provider]  polyethylene glycol (MIRALAX / GLYCOLAX) packet Take 17 g by mouth daily. 12/23/18   Bonnielee Haff, MD  simvastatin (ZOCOR) 20 MG tablet Take 20 mg by mouth daily after breakfast.     [provider]  tamsulosin (FLOMAX) 0.4 MG CAPS capsule Take 1 capsule (0.4 mg total) by mouth daily. 12/23/18   Bonnielee Haff, MD    Family History History reviewed. No pertinent family history.  Social History Social History   Tobacco Use   Smoking status: Never Smoker   Smokeless tobacco: Never Used  Substance Use Topics   Alcohol use: No   Drug use: No     Allergies   Patient has no known allergies.   Review of Systems Review  of Systems  Constitutional: Positive for activity change and appetite change. Negative for fatigue and fever.  HENT: Negative for congestion and rhinorrhea.   Eyes: Negative for visual disturbance.  Respiratory: Negative for cough, chest tightness and shortness of breath.   Cardiovascular: Negative for chest pain.  Gastrointestinal: Positive for abdominal pain and constipation.  Genitourinary: Negative for dysuria and hematuria.  Musculoskeletal: Negative for arthralgias and myalgias.  Skin: Negative for rash.  Neurological: Negative for dizziness, weakness and headaches.    all other systems are negative except as noted in the HPI and PMH.    Physical Exam Updated Vital Signs BP (!) 188/142 (BP Location:  Right Arm)    Pulse 96    Temp 98.7 F (37.1 C) (Oral)    Resp 18    SpO2 95%   Physical Exam Vitals signs and nursing note reviewed.  Constitutional:      General: He is not in acute distress.    Appearance: He is well-developed.  HENT:     Head: Normocephalic and atraumatic.     Mouth/Throat:     Pharynx: No oropharyngeal exudate.  Eyes:     Conjunctiva/sclera: Conjunctivae normal.     Pupils: Pupils are equal, round, and reactive to light.  Neck:     Musculoskeletal: Normal range of motion and neck supple.     Comments: No meningismus. Cardiovascular:     Rate and Rhythm: Normal rate and regular rhythm.     Heart sounds: Normal heart sounds. No murmur.  Pulmonary:     Effort: Pulmonary effort is normal. No respiratory distress.     Breath sounds: Normal breath sounds.  Abdominal:     General: There is distension.     Palpations: Abdomen is soft.     Tenderness: There is abdominal tenderness. There is no guarding or rebound.     Comments: Distended abdomen, diffusely tender without peritoneal signs, mildine scar well-healed  Genitourinary:    Comments: No appreciable external hemorrhoids.  There is brown stool in the rectal vault.  No gross blood.  No surrounding induration or erythema. Musculoskeletal: Normal range of motion.        General: No tenderness.  Skin:    General: Skin is warm.     Capillary Refill: Capillary refill takes less than 2 seconds.  Neurological:     General: No focal deficit present.     Mental Status: He is alert and oriented to person, place, and time. Mental status is at baseline.     Cranial Nerves: No cranial nerve deficit.     Motor: No abnormal muscle tone.     Coordination: Coordination normal.     Comments: No ataxia on finger to nose bilaterally. No pronator drift. 5/5 strength throughout. CN 2-12 intact.Equal grip strength. Sensation intact.   Psychiatric:        Behavior: Behavior normal.      ED Treatments / Results  Labs (all  labs ordered are listed, but only abnormal results are displayed) Labs Reviewed  COMPREHENSIVE METABOLIC PANEL - Abnormal; Notable for the following components:      Result Value   Potassium 6.6 (*)    CO2 21 (*)    Glucose, Bld 111 (*)    BUN 79 (*)    Creatinine, Ser 9.78 (*)    Alkaline Phosphatase 188 (*)    Total Bilirubin 1.7 (*)    GFR calc non Af Amer 4 (*)    GFR calc Af Amer 5 (*)  All other components within normal limits  CBC - Abnormal; Notable for the following components:   WBC 11.9 (*)    Platelets 147 (*)    All other components within normal limits  DIFFERENTIAL - Abnormal; Notable for the following components:   Neutro Abs 9.7 (*)    Monocytes Absolute 1.1 (*)    All other components within normal limits  URINE CULTURE  SARS CORONAVIRUS 2 (TAT 6-24 HRS)  URINALYSIS, ROUTINE W REFLEX MICROSCOPIC  CBC WITH DIFFERENTIAL/PLATELET  BASIC METABOLIC PANEL    EKG EKG Interpretation  Date/Time:  Saturday August 28 2019 05:07:03 EDT Ventricular Rate:  97 PR Interval:    QRS Duration: 85 QT Interval:  327 QTC Calculation: 416 R Axis:   54 Text Interpretation:  Sinus rhythm Minimal ST elevation, anterior leads No significant change was found Confirmed by Ezequiel Essex [id number]) on 08/28/2019 5:11:34 AM   Radiology Ct Renal Stone Study  Result Date: 08/28/2019 CLINICAL DATA:  Left-sided abdominal pain as well as rectal pain. History of hemorrhoids. EXAM: CT ABDOMEN AND PELVIS WITHOUT CONTRAST TECHNIQUE: Multidetector CT imaging of the abdomen and pelvis was performed following the standard protocol without IV contrast. COMPARISON:  12/14/2018 FINDINGS: Lower chest: No acute abnormality. Hepatobiliary: No focal liver abnormality is seen. No gallstones, gallbladder wall thickening, or biliary dilatation. Pancreas: Unremarkable. No pancreatic ductal dilatation or surrounding inflammatory changes. Spleen: Normal in size without focal abnormality. Adrenals/Urinary  Tract: No adrenal masses. Kidneys normal in position. 10 cm cyst, posterior mid to upper pole of the left kidney. Smaller low-density mass, left kidney lower pole, 2.3 cm, also likely a cyst. Three probable cysts the right, 2 medial midpole, larger mass in the upper pole, 3.5 cm. Near equal attenuation exophytic homogeneous mass, midpole right kidney measuring 14 mm likely mildly complicated cysts. These masses are stable from the prior exam. No renal stones. No hydronephrosis. Ureters are normal in course and in caliber. No ureteral stones. Bladder is decompressed with a Foley catheter. Stomach/Bowel: Stool moderately distends the rectum. No rectal wall thickening or adjacent inflammation. Stomach is unremarkable. Small bowel and colon are normal in caliber. No wall thickening. No inflammation. Mild increased stool burden in the colon most evident in right colon. Vascular/Lymphatic: Aortic atherosclerosis. No enlarged lymph nodes. Reproductive: Unremarkable. Other: No abdominal wall hernia.  No ascites. Musculoskeletal: Mixed sclerosis and lucency noted in the sacrum consistent with Paget's disease, stable when compared to the prior CT. There are multiple small lucencies noted in lumbar vertebra and in the pelvis that are stable from the prior CT, but nonspecific. Consider possible lytic lesion such as multiple myeloma in the differential. IMPRESSION: 1. No acute findings. No evidence of bowel obstruction or inflammation. 2. Rectum moderately distended with stool. Mild generalized increased stool burden in the colon. 3. No evidence of renal or ureteral stones or obstructive uropathy. 4. Multiple small lucent bone lesions in the pelvis and lumbar spine. Consider the possibility of multiple myeloma. Changes of the sacrum consistent Paget's disease. These findings are stable prior CT. 5. Bilateral renal cysts. 6. Aortic atherosclerosis. Electronically Signed   By: Lajean Manes M.D.   On: 08/28/2019 06:28     Procedures Procedures (including critical care time)  Medications Ordered in ED Medications  calcium chloride injection 1 g (has no administration in time range)  sodium chloride 0.9 % bolus 1,000 mL (1,000 mLs Intravenous New Bag/Given 08/28/19 0539)  sodium bicarbonate injection 50 mEq (50 mEq Intravenous Given 08/28/19 0638)  sodium zirconium  cyclosilicate (LOKELMA) packet 10 g (10 g Oral Given 08/28/19 7035)     Initial Impression / Assessment and Plan / ED Course  I have reviewed the triage vital signs and the nursing notes.  Pertinent labs & imaging results that were available during my care of the patient were reviewed by me and considered in my medical decision making (see chart for details).       Patient with lower abdominal pain and rectal pain found to have acute renal failure.  Has urinary retention on exam with greater than 800 cc in his bladder.  Foley catheter will be placed, IV access will be established and given IV fluids. CT scan without contrast will be obtained to evaluate for additional obstructive pathology.  Repeat labs showed potassium of 6.6.  No EKG changes.  Patient given IV calcium, bicarb, Lokelma for hyperkalemia.  He denies any chest pain or shortness of breath.  Foley catheter in place with large amount of urine returned.  Suspect postobstructive uropathy as a cause of his renal failure though cannot rule out other acute obstruction.  CT scan does not show any acute pathology but does show lytic lesions concerning for multiple myeloma given his history of Paget's disease.  D/w Dr. Maudie Mercury of hospitalist service. D/w Dr. Jonnie Finner of nephrology who will consult on patient.   CRITICAL CARE Performed by: Ezequiel Essex Total critical care time: 45 minutes Critical care time was exclusive of separately billable procedures and treating other patients. Critical care was necessary to treat or prevent imminent or life-threatening deterioration. Critical  care was time spent personally by me on the following activities: development of treatment plan with patient and/or surrogate as well as nursing, discussions with consultants, evaluation of patient's response to treatment, examination of patient, obtaining history from patient or surrogate, ordering and performing treatments and interventions, ordering and review of laboratory studies, ordering and review of radiographic studies, pulse oximetry and re-evaluation of patient's condition.  Pedro Palmer was evaluated in Emergency Department on 08/28/2019 for the symptoms described in the history of present illness. He was evaluated in the context of the global COVID-19 pandemic, which necessitated consideration that the patient might be at risk for infection with the SARS-CoV-2 virus that causes COVID-19. Institutional protocols and algorithms that pertain to the evaluation of patients at risk for COVID-19 are in a state of rapid change based on information released by regulatory bodies including the CDC and federal and state organizations. These policies and algorithms were followed during the patient's care in the ED.   Final Clinical Impressions(s) / ED Diagnoses   Final diagnoses:  AKI (acute kidney injury) [facility]  Urinary retention  Hyperkalemia    ED Discharge Orders    None       Rohan Juenger, Annie Main, MD 08/28/19 [phone]

## 2019-08-28 NOTE — Progress Notes (Signed)
Renal function improving. Continue fluids, foley. Spoke with urology. Continue foley for at least 7 days after discharge. To follow up with Dr. Graylon Good outpatient. Appreciate urology assistance.   Jonnie Finner, DO

## 2019-08-29 DIAGNOSIS — C189 Malignant neoplasm of colon, unspecified: Secondary | ICD-10-CM

## 2019-08-29 DIAGNOSIS — C61 Malignant neoplasm of prostate: Secondary | ICD-10-CM

## 2019-08-29 LAB — CBC
HCT: 39.6 % (ref 39.0–52.0)
Hemoglobin: 13 g/dL (ref 13.0–17.0)
MCH: 32.6 pg (ref 26.0–34.0)
MCHC: 32.8 g/dL (ref 30.0–36.0)
MCV: 99.2 fL (ref 80.0–100.0)
Platelets: 118 10*3/uL — ABNORMAL LOW (ref 150–400)
RBC: 3.99 MIL/uL — ABNORMAL LOW (ref 4.22–5.81)
RDW: 14.5 % (ref 11.5–15.5)
WBC: 7.2 10*3/uL (ref 4.0–10.5)
nRBC: 0 % (ref 0.0–0.2)

## 2019-08-29 LAB — URINE CULTURE: Culture: NO GROWTH

## 2019-08-29 LAB — BASIC METABOLIC PANEL
Anion gap: 8 (ref 5–15)
BUN: 23 mg/dL (ref 8–23)
CO2: 25 mmol/L (ref 22–32)
Calcium: 9.2 mg/dL (ref 8.9–10.3)
Chloride: 109 mmol/L (ref 98–111)
Creatinine, Ser: 1.21 mg/dL (ref 0.61–1.24)
GFR calc Af Amer: >60 mL/min (ref >60)
GFR calc non Af Amer: 55 mL/min — ABNORMAL LOW (ref >60)
Glucose, Bld: 103 mg/dL — ABNORMAL HIGH (ref 70–99)
Potassium: 3.7 mmol/L (ref 3.5–5.1)
Sodium: 142 mmol/L (ref 135–145)

## 2019-08-29 NOTE — Progress Notes (Signed)
PROGRESS NOTE    Pedro Palmer  TGG:[id number] DOB: [birth year] DOA: 08/28/2019 PCP: Seward Carol, MD    Brief Narrative:   Pedro Palmer is a 85 y.o. male with medical history significant of prostate CA, colon CA, paget's, chronic constipation. Presents with c/o constipation. Says that he hasn't been able to have a BM for a week. He denies any other complaints. After speaking with his wife, she reports that he doesn't like to talk or complain. She reports that he has indeed had problems with constipation and hemorrhoids that have worsened over the last week. She tried to give him his miralax at home but that hasn't helped. She doesn't endorse any aggravating factors. She notes that's he's been progressively less energetic over the last couple day to the point that he didn't really want to go outside yesterday. That concerned her cause that is out of his normal behavior. So she brought him to the ED. She denies that he had any complaints of difficulty urinating.     ED Course: Eval'd by staff. Found to be in renal failure. Foley placed. [facility] called for admission.    Assessment & Plan:   Principal Problem:   ARF (acute renal failure) (HCC) Active Problems:   Colon cancer (HCC)   HTN (hypertension)   Paget disease of bone   Prostate cancer ([place])   Constipation   Hyperkalemia   Acute renal failure Patient presenting with progressive fatigue over the past week.   Creatinine noted to be 9.78 on admission (baseline 0.9).  Patient with history of chronic constipation, no reported bowel movement x1 week.  Was noted to have severe urinary retention in ED with greater than 800 mL noted on bladder scan.   Foley catheter was placed.   CT renal stone study notable for no obstruction or inflammation, rectum mildly distended with stool, no renal/ureter stones, no  Urinary obstruction.  Urology was consulted and recommended maintaining Foley catheter x1 week and follow-up outpatient with Dr. Graylon Good. --Cr  9.78-->4.75-->1.21 --continue Foley catheter --NS at 37m/hr --aggressive bowel regimen --continue hold home lisinopril --follow renal function daily  Hyperkalemia Potassium 6.6 on admission, likely secondary to acute renal failure as above.  Patient given dose of Kayexalate and started on Lokelma 10 mg p.o. twice daily. --K 6.6-->3.7 --dc lokelma today --Continue to monitor on telemetry --repeat BMP in the AM  chronic constipation Patient reported no bowel movement x1 week prior to presentation.  Uses MiraLax as needed home. --3 BM's reported overnight --continue biscodyl 5 mg p.o. daily --colace '100mg'$  O BID --Miralax daily prn --monitor bowel movements closely  Essential hypertension BP 126/69.  On lisinopril '20mg'$  PO BID and amlodipine '5mg'$  PO daily outpatient. --Holding lisinopril for AKI as above --continue home amlodipine --started on hydralazine '25mg'$  PO TID --continue to monitor BP closely and adjust as necessary --with renal function markedly improving, likely can resume his home regimen on discharge  HLD:  Continue statin  Hx  Prostate cancer Hx Colon cancer Hx  Paget's disease CT renal stone study on admission notable for multiple small  Lucent bone lesions pelvis / lumbar spine consistent with multiple myeloma versus Paget's disease which is stable from prior CT. -- Continue outpatient follow-up   DVT prophylaxis:  heparin Code Status:  Full code Family Communication:  none Disposition Plan:  Continue inpatient, continue monitor renal function and bowel movements, anticipate discharge home on 08/30/2019 with Foley catheter in place   Consultants:    case discussed with Urology, Dr. EBenjamine Mola  Graylon Good, will need outpatient follow-up in 7 days  Procedures:    Foley catheter placement 08/28/2019  Antimicrobials:   none   Subjective: Patient seen examined at bedside, resting comfortably.  Reports bowel movements several times overnight.  No specific  complaints this morning.  Denies headache, no fever /chills/ night sweats, no nausea/vomiting/diarrhea, no chest pain, no palpitations, no shortness of breath, no weakness, no fatigue, no paresthesias.  No acute events overnight per nursing staff.  Objective: Vitals:   08/28/19 1230 08/28/19 1331 08/28/19 2103 08/29/19 0512  BP: (!) 174/83 138/73 120/68 126/69  Pulse: 83 78 76 72  Resp: (!) '25 16 16 16  '$ Temp:  98.4 F (36.9 C) 98.3 F (36.8 C) 98 F (36.7 C)  TempSrc:  Oral Oral Oral  SpO2: 95% 98% 98% 96%  Weight:      Height:        Intake/Output Summary (Last 24 hours) at 08/29/2019 0647 Last data filed at 08/29/2019 0558 Gross per 24 hour  Intake 1859.15 ml  Output 4490 ml  Net -2630.85 ml   Filed Weights   08/28/19 0540  Weight: 78 kg    Examination:  General exam: Appears calm and comfortable  Respiratory system: Clear to auscultation. Respiratory effort normal. Cardiovascular system: S1 & S2 heard, RRR. No JVD, murmurs, rubs, gallops or clicks. No pedal edema. Gastrointestinal system: Abdomen is nondistended, soft and nontender. No organomegaly or masses felt. Normal bowel sounds heard. GU:   Foley catheter noted, draining clear yellow urine Central nervous system: Alert and oriented. No focal neurological deficits. Extremities: Symmetric 5 x 5 power. Skin: No rashes, lesions or ulcers Psychiatry: Judgement and insight appear normal. Mood & affect appropriate.     Data Reviewed: I have personally reviewed following labs and imaging studies  CBC: Recent Labs  Lab 08/27/19 1821 08/28/19 0637 08/29/19 0441  WBC 12.7* 11.9* 7.2  NEUTROABS  --  9.7*  --   HGB 16.1 15.4 13.0  HCT 48.0 45.6 39.6  MCV 97.6 97.6 99.2  PLT 171 147* 409*   Basic Metabolic Panel: Recent Labs  Lab 08/27/19 1821 08/28/19 0538 08/28/19 1000  NA 134* 136 141  K 5.3* 6.6* 4.2  CL 100 101 109  CO2 19* 21* 22  GLUCOSE 120* 111* 111*  BUN 63* 79* 54*  CREATININE 9.24* 9.78*  4.75*  CALCIUM 10.0 9.8 10.4*   GFR: Estimated Creatinine Clearance: 10.8 mL/min (A) (by C-G formula based on SCr of 4.75 mg/dL (H)). Liver Function Tests: Recent Labs  Lab 08/27/19 1821 08/28/19 0538  AST 18 40  ALT 20 10  ALKPHOS 209* 188*  BILITOT 0.8 1.7*  PROT 6.6 7.2  ALBUMIN 3.7 3.8   Recent Labs  Lab 08/27/19 1821  LIPASE 19   No results for input(s): AMMONIA in the last 168 hours. Coagulation Profile: No results for input(s): INR, PROTIME in the last 168 hours. Cardiac Enzymes: No results for input(s): CKTOTAL, CKMB, CKMBINDEX, TROPONINI in the last 168 hours. BNP (last 3 results) No results for input(s): PROBNP in the last 8760 hours. HbA1C: No results for input(s): HGBA1C in the last 72 hours. CBG: No results for input(s): GLUCAP in the last 168 hours. Lipid Profile: No results for input(s): CHOL, HDL, LDLCALC, TRIG, CHOLHDL, LDLDIRECT in the last 72 hours. Thyroid Function Tests: No results for input(s): TSH, T4TOTAL, FREET4, T3FREE, THYROIDAB in the last 72 hours. Anemia Panel: No results for input(s): VITAMINB12, FOLATE, FERRITIN, TIBC, IRON, RETICCTPCT in the last 72  hours. Sepsis Labs: No results for input(s): PROCALCITON, LATICACIDVEN in the last 168 hours.  Recent Results (from the past 240 hour(s))  SARS CORONAVIRUS 2 (TAT 6-24 HRS) Nasopharyngeal Nasopharyngeal Swab     Status: None   Collection Time: 08/28/19  6:37 AM   Specimen: Nasopharyngeal Swab  Result Value Ref Range Status   SARS Coronavirus 2 NEGATIVE NEGATIVE Final    Comment: (NOTE) SARS-CoV-2 target nucleic acids are NOT DETECTED. The SARS-CoV-2 RNA is generally detectable in upper and lower respiratory specimens during the acute phase of infection. Negative results do not preclude SARS-CoV-2 infection, do not rule out co-infections with other pathogens, and should not be used as the sole basis for treatment or other patient management decisions. Negative results must be combined  with clinical observations, patient history, and epidemiological information. The expected result is Negative. Fact Sheet for Patients: SugarRoll.be Fact Sheet for Healthcare Providers: https://www.woods-mathews.com/ This test is not yet approved or cleared by the [place] FDA and  has been authorized for detection and/or diagnosis of SARS-CoV-2 by FDA under an Emergency Use Authorization (EUA). This EUA will remain  in effect (meaning this test can be used) for the duration of the COVID-19 declaration under Section 56 4(b)(1) of the Act, 21 U.S.C. section 360bbb-3(b)(1), unless the authorization is terminated or revoked sooner. Performed at [facility], [place] [address], [place], [place] [address]          Radiology Studies: Ct Renal Stone Study  Result Date: 08/28/2019 CLINICAL DATA:  Left-sided abdominal pain as well as rectal pain. History of hemorrhoids. EXAM: CT ABDOMEN AND PELVIS WITHOUT CONTRAST TECHNIQUE: Multidetector CT imaging of the abdomen and pelvis was performed following the standard protocol without IV contrast. COMPARISON:  12/14/2018 FINDINGS: Lower chest: No acute abnormality. Hepatobiliary: No focal liver abnormality is seen. No gallstones, gallbladder wall thickening, or biliary dilatation. Pancreas: Unremarkable. No pancreatic ductal dilatation or surrounding inflammatory changes. Spleen: Normal in size without focal abnormality. Adrenals/Urinary Tract: No adrenal masses. Kidneys normal in position. 10 cm cyst, posterior mid to upper pole of the left kidney. Smaller low-density mass, left kidney lower pole, 2.3 cm, also likely a cyst. Three probable cysts the right, 2 medial midpole, larger mass in the upper pole, 3.5 cm. Near equal attenuation exophytic homogeneous mass, midpole right kidney measuring 14 mm likely mildly complicated cysts. These masses are stable from the prior exam. No renal stones. No  hydronephrosis. Ureters are normal in course and in caliber. No ureteral stones. Bladder is decompressed with a Foley catheter. Stomach/Bowel: Stool moderately distends the rectum. No rectal wall thickening or adjacent inflammation. Stomach is unremarkable. Small bowel and colon are normal in caliber. No wall thickening. No inflammation. Mild increased stool burden in the colon most evident in right colon. Vascular/Lymphatic: Aortic atherosclerosis. No enlarged lymph nodes. Reproductive: Unremarkable. Other: No abdominal wall hernia.  No ascites. Musculoskeletal: Mixed sclerosis and lucency noted in the sacrum consistent with Paget's disease, stable when compared to the prior CT. There are multiple small lucencies noted in lumbar vertebra and in the pelvis that are stable from the prior CT, but nonspecific. Consider possible lytic lesion such as multiple myeloma in the differential. IMPRESSION: 1. No acute findings. No evidence of bowel obstruction or inflammation. 2. Rectum moderately distended with stool. Mild generalized increased stool burden in the colon. 3. No evidence of renal or ureteral stones or obstructive uropathy. 4. Multiple small lucent bone lesions in the pelvis and lumbar spine. Consider the possibility  of multiple myeloma. Changes of the sacrum consistent Paget's disease. These findings are stable prior CT. 5. Bilateral renal cysts. 6. Aortic atherosclerosis. Electronically Signed   By: Lajean Manes M.D.   On: 08/28/2019 06:28        Scheduled Meds: . amLODipine  10 mg Oral Daily  . aspirin EC  81 mg Oral Daily  . bisacodyl  5 mg Oral Daily  . docusate sodium  100 mg Oral BID  . heparin  5,000 Units Subcutaneous Q8H  . hydrALAZINE  25 mg Oral Q8H  . hydrocortisone  25 mg Rectal BID  . multivitamin with minerals  1 tablet Oral Daily  . simvastatin  20 mg Oral QPM  . sodium zirconium cyclosilicate  10 g Oral BID  . tamsulosin  0.4 mg Oral Daily  . vitamin C  250 mg Oral Daily    Continuous Infusions: . sodium chloride 75 mL/hr at 08/29/19 0120     LOS: 1 day     Time spent: 25 minutes spent on chart review, discussion with nursing staff, consultants, updating family and interview/physical exam; more than 50% of that time was spent in counseling and/or coordination of care.     Georgiann Neider J [place] DO [facility] Pager [phone]  If 7PM-7AM, please contact night-coverage [email or web address] Password [facility] 08/29/2019, 6:47 AM

## 2019-08-30 LAB — BASIC METABOLIC PANEL
Anion gap: 7 (ref 5–15)
BUN: 14 mg/dL (ref 8–23)
CO2: 23 mmol/L (ref 22–32)
Calcium: 9.2 mg/dL (ref 8.9–10.3)
Chloride: 110 mmol/L (ref 98–111)
Creatinine, Ser: 0.85 mg/dL (ref 0.61–1.24)
GFR calc Af Amer: >60 mL/min (ref >60)
GFR calc non Af Amer: >60 mL/min (ref >60)
Glucose, Bld: 104 mg/dL — ABNORMAL HIGH (ref 70–99)
Potassium: 3.5 mmol/L (ref 3.5–5.1)
Sodium: 140 mmol/L (ref 135–145)

## 2019-08-30 MED ORDER — AMLODIPINE BESYLATE 10 MG PO TABS: 10.0000 mg | ORAL_TABLET | Freq: Every day | ORAL | 0 refills | Status: AC

## 2019-08-30 MED ORDER — DOCUSATE SODIUM 100 MG PO CAPS: 100.0000 mg | ORAL_CAPSULE | Freq: Two times a day (BID) | ORAL | 0 refills | Status: AC

## 2019-08-30 MED ORDER — POLYETHYLENE GLYCOL 3350 17 G PO PACK: 17.0000 g | PACK | Freq: Two times a day (BID) | ORAL | 0 refills | Status: AC

## 2019-08-30 NOTE — Discharge Instructions (Signed)
Acute Kidney Injury, Adult ° °Acute kidney injury is a sudden worsening of kidney function. The kidneys are organs that have several jobs. They filter the blood to remove waste products and extra fluid. They also maintain a healthy balance of minerals and hormones in the body, which helps control blood pressure and keep bones strong. With this condition, your kidneys do not do their jobs as well as they should. °This condition ranges from mild to severe. Over time it may develop into long-lasting (chronic) kidney disease. Early detection and treatment may prevent acute kidney injury from developing into a chronic condition. °What are the causes? °Common causes of this condition include: °· A problem with blood flow to the kidneys. This may be caused by: °? Low blood pressure (hypotension) or shock. °? Blood loss. °? Heart and blood vessel (cardiovascular) disease. °? Severe burns. °? Liver disease. °· Direct damage to the kidneys. This may be caused by: °? Certain medicines. °? A kidney infection. °? Poisoning. °? Being around or in contact with toxic substances. °? A surgical wound. °? A hard, direct hit to the kidney area. °· A sudden blockage of urine flow. This may be caused by: °? Cancer. °? Kidney stones. °? An enlarged prostate in males. °What are the signs or symptoms? °Symptoms of this condition may not be obvious until the condition becomes severe. Symptoms of this condition can include: °· Tiredness (lethargy), or difficulty staying awake. °· Nausea or vomiting. °· Swelling (edema) of the face, legs, ankles, or feet. °· Problems with urination, such as: °? Abdominal pain, or pain along the side of your stomach (flank). °? Decreased urine production. °? Decrease in the force of urine flow. °· Muscle twitches and cramps, especially in the legs. °· Confusion or trouble concentrating. °· Loss of appetite. °· Fever. °How is this diagnosed? °This condition may be diagnosed with tests, including: °· Blood  tests. °· Urine tests. °· Imaging tests. °· A test in which a sample of tissue is removed from the kidneys to be examined under a microscope (kidney biopsy). °How is this treated? °Treatment for this condition depends on the cause and how severe the condition is. In mild cases, treatment may not be needed. The kidneys may heal on their own. In more severe cases, treatment will involve: °· Treating the cause of the kidney injury. This may involve changing any medicines you are taking or adjusting your dosage. °· Fluids. You may need specialized IV fluids to balance your body's needs. °· Having a catheter placed to drain urine and prevent blockages. °· Preventing problems from occurring. This may mean avoiding certain medicines or procedures that can cause further injury to the kidneys. °In some cases treatment may also require: °· A procedure to remove toxic wastes from the body (dialysis or continuous renal replacement therapy - CRRT). °· Surgery. This may be done to repair a torn kidney, or to remove the blockage from the urinary system. °Follow these instructions at home: °Medicines °· Take over-the-counter and prescription medicines only as told by your health care provider. °· Do not take any new medicines without your health care provider's approval. Many medicines can worsen your kidney damage. °· Do not take any vitamin and mineral supplements without your health care provider's approval. Many nutritional supplements can worsen your kidney damage. °Lifestyle °· If your health care provider prescribed changes to your diet, follow them. You may need to decrease the amount of protein you eat. °· Achieve and maintain a healthy   weight. If you need help with this, ask your health care provider.  Start or continue an exercise plan. Try to exercise at least 30 minutes a day, 5 days a week.  Do not use any tobacco products, such as cigarettes, chewing tobacco, and e-cigarettes. If you need help quitting, ask your  health care provider. General instructions  Keep track of your blood pressure. Report changes in your blood pressure as told by your health care provider.  Stay up to date with immunizations. Ask your health care provider which immunizations you need.  Keep all follow-up visits as told by your health care provider. This is important. Where to find more information  American Association of Kidney Patients: BombTimer.gl  National Kidney Foundation: www.kidney.[place]: https://mathis.com/  [facility]: ? www.lifeoptions.org ? www.kidneyschool.org Contact a health care provider if:  Your symptoms get worse.  You develop new symptoms. Get help right away if:  You develop symptoms of worsening kidney disease, which include: ? Headaches. ? Abnormally dark or light skin. ? Easy bruising. ? Frequent hiccups. ? Chest pain. ? Shortness of breath. ? End of menstruation in women. ? Seizures. ? Confusion or altered mental status. ? Abdominal or back pain. ? Itchiness.  You have a fever.  Your body is producing less urine.  You have pain or bleeding when you urinate. Summary  Acute kidney injury is a sudden worsening of kidney function.  Acute kidney injury can be caused by problems with blood flow to the kidneys, direct damage to the kidneys, and sudden blockage of urine flow.  Symptoms of this condition may not be obvious until it becomes severe. Symptoms may include edema, lethargy, confusion, nausea or vomiting, and problems passing urine.  This condition can usually be diagnosed with blood tests, urine tests, and imaging tests. Sometimes a kidney biopsy is done to diagnose this condition.  Treatment for this condition often involves treating the underlying cause. It is treated with fluids, medicines, dialysis, diet changes, or surgery. This information is not intended to replace advice given to you by your health care provider. Make  sure you discuss any questions you have with your health care provider. Document Released: 06/10/2011 Document Revised: 11/07/2017 Document Reviewed: 11/15/2016 Elsevier Patient Education  2020 [employer].   Acute Urinary Retention, Male  Acute urinary retention means that you cannot pee (urinate) at all, or that you pee too little and your bladder is not emptied completely. If it is not treated, it can lead to kidney damage or other serious problems. Follow these instructions at home:  Take over-the-counter and prescription medicines only as told by your doctor. Ask your doctor what medicines you should stay away from. Do not take any medicine unless your doctor says it is okay to do so.  If you were sent home with a tube that drains the bladder (catheter), take care of it as told by your doctor.  Drink enough fluid to keep your pee clear or pale yellow.  If you were given an antibiotic, take it as told by your doctor. Do not stop taking the antibiotic even if you start to feel better.  Do not use any products that contain nicotine or tobacco, such as cigarettes and e-cigarettes. If you need help quitting, ask your doctor.  Watch for changes in your symptoms. Tell your doctor about them.  If told, track changes in your blood pressure at home. Tell your doctor about them.  Keep all follow-up visits as  told by your doctor. This is important. Contact a doctor if:  You have spasms or you leak pee when you have spasms. Get help right away if:  You have chills or a fever.  You have a tube that drains the bladder and: ? The tube stops draining pee. ? The tube falls out.  You have blood in your pee. Summary  Acute urinary retention means that you have problems peeing. It may mean that you cannot pee at all, or that you pee too little.  If this condition is not treated, it can lead to kidney damage or other serious problems.  If you were sent home with a tube that drains the  bladder, take care of it as told by your doctor.  Monitor any changes in your symptoms. Tell your doctor about any changes. This information is not intended to replace advice given to you by your health care provider. Make sure you discuss any questions you have with your health care provider. Document Released: 05/13/2008 Document Revised: 02/11/2019 Document Reviewed: 12/27/2016 Elsevier Patient Education  [place], Adult An indwelling urinary catheter is a thin tube that is put into your bladder. The tube helps to drain pee (urine) out of your body. The tube goes in through your urethra. Your urethra is where pee comes out of your body. Your pee will come out through the catheter, then it will go into a bag (drainage bag). Take good care of your catheter so it will work well. How to wear your catheter and bag Supplies needed  Sticky tape (adhesive tape) or a leg strap.  Alcohol wipe or soap and water (if you use tape).  A clean towel (if you use tape).  Large overnight bag.  Smaller bag (leg bag). Wearing your catheter Attach your catheter to your leg with tape or a leg strap.  Make sure the catheter is not pulled tight.  If a leg strap gets wet, take it off and put on a dry strap.  If you use tape to hold the bag on your leg: 1. Use an alcohol wipe or soap and water to wash your skin where the tape made it sticky before. 2. Use a clean towel to pat-dry that skin. 3. Use new tape to make the bag stay on your leg. Wearing your bags You should have been given a large overnight bag.  You may wear the overnight bag in the day or night.  Always have the overnight bag lower than your bladder.  Do not let the bag touch the floor.  Before you go to sleep, put a clean plastic bag in a wastebasket. Then hang the overnight bag inside the wastebasket. You should also have a smaller leg bag that fits under your clothes.  Always wear the  leg bag below your knee.  Do not wear your leg bag at night. How to care for your skin and catheter Supplies needed  A clean washcloth.  Water and mild soap.  A clean towel. Caring for your skin and catheter      Clean the skin around your catheter every day: ? Wash your hands with soap and water. ? Wet a clean washcloth in warm water and mild soap. ? Clean the skin around your urethra. ? If you are male: ? Gently spread the folds of skin around your vagina (labia). ? With the washcloth in your other hand, wipe the inner side of your labia on each  side. Wipe from front to back. ? If you are male: ? Pull back any skin that covers the end of your penis (foreskin). ? With the washcloth in your other hand, wipe your penis in small circles. Start wiping at the tip of your penis, then move away from the catheter. ? Move the foreskin back in place, if needed. ? With your free hand, hold the catheter close to where it goes into your body. ? Keep holding the catheter during cleaning so it does not get pulled out. ? With the washcloth in your other hand, clean the catheter. ? Only wipe downward on the catheter. ? Do not wipe upward toward your body. Doing this may push germs into your urethra and cause infection. ? Use a clean towel to pat-dry the catheter and the skin around it. Make sure to wipe off all soap. ? Wash your hands with soap and water.  Shower every day. Do not take baths.  Do not use cream, ointment, or lotion on the area where the catheter goes into your body, unless your doctor tells you to.  Do not use powders, sprays, or lotions on your genital area.  Check your skin around the catheter every day for signs of infection. Check for: ? Redness, swelling, or pain. ? Fluid or blood. ? Warmth. ? Pus or a bad smell. How to empty the bag Supplies needed  Rubbing alcohol.  Gauze pad or cotton ball.  Tape or a leg strap. Emptying the bag Pour the pee out of  your bag when it is ?- full, or at least 2-3 times a day. Do this for your overnight bag and your leg bag. 1. Wash your hands with soap and water. 2. Separate (detach) the bag from your leg. 3. Hold the bag over the toilet or a clean pail. Keep the bag lower than your hips and bladder. This is so the pee (urine) does not go back into the tube. 4. Open the pour spout. It is at the bottom of the bag. 5. Empty the pee into the toilet or pail. Do not let the pour spout touch any surface. 6. Put rubbing alcohol on a gauze pad or cotton ball. 7. Use the gauze pad or cotton ball to clean the pour spout. 8. Close the pour spout. 9. Attach the bag to your leg with tape or a leg strap. 10. Wash your hands with soap and water. Follow instructions for cleaning the drainage bag:  From the product maker.  As told by your doctor. How to change the bag Supplies needed  Alcohol wipes.  A clean bag.  Tape or a leg strap. Changing the bag Replace your bag when it starts to leak, smell bad, or look dirty. 1. Wash your hands with soap and water. 2. Separate the dirty bag from your leg. 3. Pinch the catheter with your fingers so that pee does not spill out. 4. Separate the catheter tube from the bag tube where these tubes connect (at the connection valve). Do not let the tubes touch any surface. 5. Clean the end of the catheter tube with an alcohol wipe. Use a different alcohol wipe to clean the end of the bag tube. 6. Connect the catheter tube to the tube of the clean bag. 7. Attach the clean bag to your leg with tape or a leg strap. Do not make the bag tight on your leg. 8. Wash your hands with soap and water. General rules   Never  pull on your catheter. Never try to take it out. Doing that can hurt you.  Always wash your hands before and after you touch your catheter or bag. Use a mild, fragrance-free soap. If you do not have soap and water, use hand sanitizer.  Always make sure there are no  twists or bends (kinks) in the catheter tube.  Always make sure there are no leaks in the catheter or bag.  Drink enough fluid to keep your pee pale yellow.  Do not take baths, swim, or use a hot tub.  If you are male, wipe from front to back after you poop (have a bowel movement). Contact a doctor if:  Your pee is cloudy.  Your pee smells worse than usual.  Your catheter gets clogged.  Your catheter leaks.  Your bladder feels full. Get help right away if:  You have redness, swelling, or pain where the catheter goes into your body.  You have fluid, blood, pus, or a bad smell coming from the area where the catheter goes into your body.  Your skin feels warm where the catheter goes into your body.  You have a fever.  You have pain in your: ? Belly (abdomen). ? Legs. ? Lower back. ? Bladder.  You see blood in the catheter.  Your pee is pink or red.  You feel sick to your stomach (nauseous).  You throw up (vomit).  You have chills.  Your pee is not draining into the bag.  Your catheter gets pulled out. Summary  An indwelling urinary catheter is a thin tube that is placed into the bladder to help drain pee (urine) out of the body.  The catheter is placed into the part of the body that drains pee from the bladder (urethra).  Taking good care of your catheter will keep it working properly and help prevent problems.  Always wash your hands before and after touching your catheter or bag.  Never pull on your catheter or try to take it out. This information is not intended to replace advice given to you by your health care provider. Make sure you discuss any questions you have with your health care provider. Document Released: 03/22/2013 Document Revised: 03/19/2019 Document Reviewed: 07/11/2017 Elsevier Patient Education  2020 [employer].

## 2019-08-30 NOTE — Discharge Summary (Signed)
Physician Discharge Summary  Pedro Palmer BDZ:[id number] DOB: 1933/08/04 DOA: 08/28/2019  PCP: Pedro Carol, MD  Admit date: 08/28/2019 Discharge date: 08/30/2019  Admitted From: Home Disposition:  Home  Recommendations for Outpatient Follow-up:  1. Follow up with PCP in 1 week 2. Follow-up with urology, Dr. Kerrie Pleasure in 2-3 days following hospitalization 3. Please obtain BMP in one week 4. Continue Foley catheter until follows up with urology 5. Increased amlodipine to 10 mg p.o. daily secondary to poorly controlled HTN 6. Increased MiraLAX to twice daily for chronic constipation  Home Health: No Equipment/Devices: Foley catheter  Discharge Condition: Stable CODE STATUS: Full code Diet recommendation: Heart Healthy  History of present illness:  Pedro Troxleris a 85 y.o.malewith medical history significant ofprostate CA, colon CA, paget's, chronic constipation. Presents with c/o constipation. Says that he hasn't been able to have a BM for a week. He denies any other complaints. After speaking with his wife, she reports that he doesn't like to talk or complain. She reports that he has indeed had problems with constipation and hemorrhoids that have worsened over the last week. She tried to give him his miralax at home but that hasn't helped. She doesn't endorse any aggravating factors. She notes that's he's been progressively less energetic over the last couple day to the point that he didn't really want to go outside yesterday. That concerned her cause that is out of his normal behavior. So she brought him to the ED. She denies that he had any complaints of difficulty urinating.  ED Course:Found to be in renal failure. Foley placed. TRH called for admission.  Hospital course:  Acute renal failure Patient presenting with progressive fatigue over the past week.   Creatinine noted to be 9.78 on admission (baseline 0.9).  Patient with history of chronic constipation, no  reported bowel movement x1 week.  Was noted to have severe urinary retention in ED with greater than 800 mL noted on bladder scan.   Foley catheter was placed.   CT renal stone study notable for no obstruction or inflammation, rectum mildly distended with stool, no renal/ureter stones, no urinary obstruction.  Urology was consulted and recommended maintaining Foley catheter x1 week and follow-up outpatient with Dr. Graylon Good.  Creatinine improved from a high of 9.78 on admission to 0.85 at time of discharge.  Continue aggressive outpatient bowel regimen as likely etiology from severe chronic constipation.  Hyperkalemia Potassium 6.6 on admission, likely secondary to acute renal failure as above.  Patient given dose of Kayexalate and started on Lokelma 10 mg p.o. twice daily.  Potassium improved to 3.7. Lokelma was discontinued on 9/20.  Potassium at time of discharge 3.5.  Recommend repeat BMP in 1 week.  Severe chronic constipation Patient reported no bowel movement x1 week prior to presentation.  Uses MiraLax as needed home.  Patient was continued on his bisacodyl 5 mg p.o. daily.  He was started on Colace 100 mg p.o. twice daily and MiraLAX twice daily with improvement of bowel movements.  Continue to monitor bowel movements closely at home, recommend at least 1 BM per day in the setting of his severe chronic constipation.  Essential hypertension BP 126/69.  On lisinopril '20mg'$  PO BID and amlodipine '5mg'$  PO daily outpatient.  Patient's lisinopril was initially held secondary to acute renal failure as above.  His home amlodipine was increased to 10 mg p.o. daily and he was started on hydralazine 25 mg p.o. 3 times daily while off of his lisinopril.  On discharge  may resume lisinopril now that renal failure has now resolved.  Will increase dose of amlodipine to 10 mg p.o. daily for better blood pressure control.  Follow-up with PCP in 1 week for further management of antihypertensive regimen.  HLD:   Continue statin  Hx  Prostate cancer Hx Colon cancer Hx  Paget's disease CT renal stone study on admission notable for multiple small  Lucent bone lesions pelvis / lumbar spine consistent with multiple myeloma versus Paget's disease which is stable from prior CT. Continue outpatient follow-up  Discharge Diagnoses:  Active Problems:   Colon cancer ([place])   HTN (hypertension)   Paget disease of bone   Prostate cancer ([place])   Constipation    Discharge Instructions  Discharge Instructions    Call MD for:  difficulty breathing, headache or visual disturbances   Complete by: As directed    Call MD for:  extreme fatigue   Complete by: As directed    Call MD for:  persistant dizziness or light-headedness   Complete by: As directed    Call MD for:  persistant nausea and vomiting   Complete by: As directed    Call MD for:  severe uncontrolled pain   Complete by: As directed    Call MD for:  temperature >100.4   Complete by: As directed    Diet - low sodium heart healthy   Complete by: As directed    Do Not Remove Foley   Complete by: As directed    Increase activity slowly   Complete by: As directed    Leg bag during the day   Complete by: As directed      Allergies as of 08/30/2019   No Known Allergies     Medication List    TAKE these medications   amLODipine 10 MG tablet Commonly known as: NORVASC Take 1 tablet (10 mg total) by mouth daily. What changed:   medication strength  how much to take   aspirin EC 81 MG tablet Take 81 mg by mouth daily.   bisacodyl 5 MG EC tablet Commonly known as: DULCOLAX Take 5 mg by mouth daily.   docusate sodium 100 MG capsule Commonly known as: COLACE Take 1 capsule (100 mg total) by mouth 2 (two) times daily.   lisinopril 20 MG tablet Commonly known as: ZESTRIL Take 1 tablet (20 mg total) by mouth 2 (two) times daily.   meloxicam 15 MG tablet Commonly known as: MOBIC Take 15 mg by mouth daily as needed for pain.    multivitamin with minerals Tabs tablet Take 1 tablet by mouth daily.   polyethylene glycol 17 g packet Commonly known as: MIRALAX / GLYCOLAX Take 17 g by mouth 2 (two) times daily. Ensure at least one bowel movement daily What changed:   when to take this  additional instructions   tamsulosin 0.4 MG Caps capsule Commonly known as: FLOMAX Take 1 capsule (0.4 mg total) by mouth daily.   VITAMIN C PO Take 1 tablet by mouth daily.   Zocor 20 MG tablet Generic drug: simvastatin Take 20 mg by mouth every evening.      Follow-up Information    Haskel Schroeder, MD Follow up in 2 day(s).   Why: Follow up urology appt. Contact information: [place] [address] [phone]        Pedro Carol, MD. Schedule an appointment as soon as possible for a visit in 1 week(s).   Specialty: Internal Medicine Contact information: [phone]  [address] [place] [address] [phone]          No Known Allergies  Consultations:  Urology, Dr. Kerrie Pleasure   Procedures/Studies: Ct Renal Stone Study  Result Date: 08/28/2019 CLINICAL DATA:  Left-sided abdominal pain as well as rectal pain. History of hemorrhoids. EXAM: CT ABDOMEN AND PELVIS WITHOUT CONTRAST TECHNIQUE: Multidetector CT imaging of the abdomen and pelvis was performed following the standard protocol without IV contrast. COMPARISON:  12/14/2018 FINDINGS: Lower chest: No acute abnormality. Hepatobiliary: No focal liver abnormality is seen. No gallstones, gallbladder wall thickening, or biliary dilatation. Pancreas: Unremarkable. No pancreatic ductal dilatation or surrounding inflammatory changes. Spleen: Normal in size without focal abnormality. Adrenals/Urinary Tract: No adrenal masses. Kidneys normal in position. 10 cm cyst, posterior mid to upper pole of the left kidney. Smaller low-density mass, left kidney lower pole, 2.3 cm, also likely a cyst. Three probable cysts the right, 2  medial midpole, larger mass in the upper pole, 3.5 cm. Near equal attenuation exophytic homogeneous mass, midpole right kidney measuring 14 mm likely mildly complicated cysts. These masses are stable from the prior exam. No renal stones. No hydronephrosis. Ureters are normal in course and in caliber. No ureteral stones. Bladder is decompressed with a Foley catheter. Stomach/Bowel: Stool moderately distends the rectum. No rectal wall thickening or adjacent inflammation. Stomach is unremarkable. Small bowel and colon are normal in caliber. No wall thickening. No inflammation. Mild increased stool burden in the colon most evident in right colon. Vascular/Lymphatic: Aortic atherosclerosis. No enlarged lymph nodes. Reproductive: Unremarkable. Other: No abdominal wall hernia.  No ascites. Musculoskeletal: Mixed sclerosis and lucency noted in the sacrum consistent with Paget's disease, stable when compared to the prior CT. There are multiple small lucencies noted in lumbar vertebra and in the pelvis that are stable from the prior CT, but nonspecific. Consider possible lytic lesion such as multiple myeloma in the differential. IMPRESSION: 1. No acute findings. No evidence of bowel obstruction or inflammation. 2. Rectum moderately distended with stool. Mild generalized increased stool burden in the colon. 3. No evidence of renal or ureteral stones or obstructive uropathy. 4. Multiple small lucent bone lesions in the pelvis and lumbar spine. Consider the possibility of multiple myeloma. Changes of the sacrum consistent Paget's disease. These findings are stable prior CT. 5. Bilateral renal cysts. 6. Aortic atherosclerosis. Electronically Signed   By: Lajean Manes M.D.   On: 08/28/2019 06:28     Subjective: Patient seen and examined at bedside, nursing present.  No complaints this morning.  Continues with Foley catheter in place with good urine output.  Request that I call and update his spouse early this morning about  discharge today.  Denies headache, no fever/chills/night sweats, no nausea/vomit/diarrhea, no chest pain, no palpitations, no abdominal pain, no weakness, no fatigue, no paresthesias.  No acute events overnight per nursing staff.   Discharge Exam: Vitals:   08/29/19 2015 08/30/19 0542  BP: (!) 149/77 (!) 165/79  Pulse: 66 65  Resp: 16 18  Temp: 98.3 F (36.8 C) 98 F (36.7 C)  SpO2: 97% 97%   Vitals:   08/29/19 0919 08/29/19 1400 08/29/19 2015 08/30/19 0542  BP: 127/64 131/63 (!) 149/77 (!) 165/79  Pulse: 68 75 66 65  Resp:  '16 16 18  '$ Temp:  98.6 F (37 C) 98.3 F (36.8 C) 98 F (36.7 C)  TempSrc:  Oral Oral Oral  SpO2:  96% 97% 97%  Weight:      Height:  General: Pt is alert, awake, not in acute distress Cardiovascular: RRR, S1/S2 +, no rubs, no gallops Respiratory: CTA bilaterally, no wheezing, no rhonchi Abdominal: Soft, NT, ND, bowel sounds + GU: Foley catheter noted in place, draining clear yellow urine Extremities: no edema, no cyanosis    The results of significant diagnostics from this hospitalization (including imaging, microbiology, ancillary and laboratory) are listed below for reference.     Microbiology: Recent Results (from the past 240 hour(s))  Urine Culture     Status: None   Collection Time: 08/28/19  5:22 AM   Specimen: Urine, Random  Result Value Ref Range Status   Specimen Description   Final    URINE, RANDOM Performed at [facility] [address] [place], [place] [address]    Special Requests   Final    NONE Performed at [facility], [place] [address], [place], [place] [address]    Culture   Final    NO GROWTH Performed at [facility], [place] [address] [place], [place] [address]    Report Status 08/29/2019 FINAL  Final  SARS CORONAVIRUS 2 (TAT 6-24 HRS) Nasopharyngeal Nasopharyngeal Swab     Status: None   Collection Time: 08/28/19  6:37 AM   Specimen: Nasopharyngeal Swab  Result  Value Ref Range Status   SARS Coronavirus 2 NEGATIVE NEGATIVE Final    Comment: (NOTE) SARS-CoV-2 target nucleic acids are NOT DETECTED. The SARS-CoV-2 RNA is generally detectable in upper and lower respiratory specimens during the acute phase of infection. Negative results do not preclude SARS-CoV-2 infection, do not rule out co-infections with other pathogens, and should not be used as the sole basis for treatment or other patient management decisions. Negative results must be combined with clinical observations, patient history, and epidemiological information. The expected result is Negative. Fact Sheet for Patients: SugarRoll.be Fact Sheet for Healthcare Providers: https://www.woods-mathews.com/ This test is not yet approved or cleared by the [place] FDA and  has been authorized for detection and/or diagnosis of SARS-CoV-2 by FDA under an Emergency Use Authorization (EUA). This EUA will remain  in effect (meaning this test can be used) for the duration of the COVID-19 declaration under Section 56 4(b)(1) of the Act, 21 U.S.C. section 360bbb-3(b)(1), unless the authorization is terminated or revoked sooner. Performed at [facility], [place] [address], [place], [place] [address]      Labs: BNP (last 3 results) No results for input(s): BNP in the last 8760 hours. Basic Metabolic Panel: Recent Labs  Lab 08/27/19 1821 08/28/19 0538 08/28/19 1000 08/29/19 0712 08/30/19 0451  NA 134* 136 141 142 140  K 5.3* 6.6* 4.2 3.7 3.5  CL 100 101 109 109 110  CO2 19* 21* '22 25 23  '$ GLUCOSE 120* 111* 111* 103* 104*  BUN 63* 79* 54* 23 14  CREATININE 9.24* 9.78* 4.75* 1.21 0.85  CALCIUM 10.0 9.8 10.4* 9.2 9.2   Liver Function Tests: Recent Labs  Lab 08/27/19 1821 08/28/19 0538  AST 18 40  ALT 20 10  ALKPHOS 209* 188*  BILITOT 0.8 1.7*  PROT 6.6 7.2  ALBUMIN 3.7 3.8   Recent Labs  Lab 08/27/19 1821  LIPASE 19   No  results for input(s): AMMONIA in the last 168 hours. CBC: Recent Labs  Lab 08/27/19 1821 08/28/19 0637 08/29/19 0441  WBC 12.7* 11.9* 7.2  NEUTROABS  --  9.7*  --   HGB 16.1 15.4 13.0  HCT 48.0 45.6 39.6  MCV 97.6 97.6 99.2  PLT 171 147* 118*   Cardiac Enzymes: No results for input(s): CKTOTAL, CKMB, CKMBINDEX, TROPONINI in the last 168 hours. BNP: Invalid input(s): POCBNP CBG: No results for input(s): GLUCAP in the last 168 hours. D-Dimer No results for input(s): DDIMER in the last 72 hours. Hgb A1c No results for input(s): HGBA1C in the last 72 hours. Lipid Profile No results for input(s): CHOL, HDL, LDLCALC, TRIG, CHOLHDL, LDLDIRECT in the last 72 hours. Thyroid function studies No results for input(s): TSH, T4TOTAL, T3FREE, THYROIDAB in the last 72 hours.  Invalid input(s): FREET3 Anemia work up No results for input(s): VITAMINB12, FOLATE, FERRITIN, TIBC, IRON, RETICCTPCT in the last 72 hours. Urinalysis    Component Value Date/Time   COLORURINE YELLOW 08/28/2019 0522   APPEARANCEUR CLEAR 08/28/2019 0522   LABSPEC 1.014 08/28/2019 0522   PHURINE 7.0 08/28/2019 0522   GLUCOSEU NEGATIVE 08/28/2019 0522   HGBUR NEGATIVE 08/28/2019 0522   BILIRUBINUR NEGATIVE 08/28/2019 0522   KETONESUR NEGATIVE 08/28/2019 0522   PROTEINUR NEGATIVE 08/28/2019 0522   NITRITE NEGATIVE 08/28/2019 0522   LEUKOCYTESUR NEGATIVE 08/28/2019 0522   Sepsis Labs Invalid input(s): PROCALCITONIN,  WBC,  LACTICIDVEN Microbiology Recent Results (from the past 240 hour(s))  Urine Culture     Status: None   Collection Time: 08/28/19  5:22 AM   Specimen: Urine, Random  Result Value Ref Range Status   Specimen Description   Final    URINE, RANDOM Performed at [facility], [place] [address], [place], [place] [address]    Special Requests   Final    NONE Performed at [facility], [place] [address] [place], [place] [address]    Culture   Final    NO  GROWTH Performed at [facility], [place] [address] [place], [place] [address]    Report Status 08/29/2019 FINAL  Final  SARS CORONAVIRUS 2 (TAT 6-24 HRS) Nasopharyngeal Nasopharyngeal Swab     Status: None   Collection Time: 08/28/19  6:37 AM   Specimen: Nasopharyngeal Swab  Result Value Ref Range Status   SARS Coronavirus 2 NEGATIVE NEGATIVE Final    Comment: (NOTE) SARS-CoV-2 target nucleic acids are NOT DETECTED. The SARS-CoV-2 RNA is generally detectable in upper and lower respiratory specimens during the acute phase of infection. Negative results do not preclude SARS-CoV-2 infection, do not rule out co-infections with other pathogens, and should not be used as the sole basis for treatment or other patient management decisions. Negative results must be combined with clinical observations, patient history, and epidemiological information. The expected result is Negative. Fact Sheet for Patients: SugarRoll.be Fact Sheet for Healthcare Providers: https://www.woods-mathews.com/ This test is not yet approved or cleared by the [place] FDA and  has been authorized for detection and/or diagnosis of SARS-CoV-2 by FDA under an Emergency Use Authorization (EUA). This EUA will remain  in effect (meaning this test can be used) for the duration of the COVID-19 declaration under Section 56 4(b)(1) of the Act, 21 U.S.C. section 360bbb-3(b)(1), unless the authorization is terminated or revoked sooner. Performed at [facility], [place] [address] [place], [place] [address]      Time coordinating discharge: Over 30 minutes  SIGNED:   Eric J [place] DO  [facility] 08/30/2019, 7:50 AM

## 2019-10-19 ENCOUNTER — Other Ambulatory Visit: Payer: Self-pay | Admitting: Urology

## 2019-11-12 NOTE — Patient Instructions (Addendum)
DUE TO COVID-19 ONLY ONE VISITOR IS ALLOWED TO COME WITH YOU AND STAY IN THE WAITING ROOM ONLY DURING PRE OP AND PROCEDURE DAY OF SURGERY. THE 1 VISITOR MAY VISIT WITH YOU AFTER SURGERY IN YOUR PRIVATE ROOM DURING VISITING HOURS ONLY!   ONCE YOUR COVID TEST IS COMPLETED, PLEASE BEGIN THE QUARANTINE INSTRUCTIONS AS OUTLINED IN YOUR HANDOUT.                Lissa Hoard    Your procedure is scheduled on: Wednesday 11/17/2019   Report to [facility] Main  Entrance    Report to admitting at   [place] DR. POLITE TODAY TO GET INSTRUCTIONS ABOUT ASPIRIN FOR YOUR SURGERY (Whether to stop it or continue to take it)!     Call this number if you have problems the morning of surgery [phone]    Remember: Do not eat food or drink liquids :After Midnight.     BRUSH YOUR TEETH MORNING OF SURGERY AND RINSE YOUR MOUTH OUT, NO CHEWING GUM CANDY OR MINTS.     Take these medicines the morning of surgery with A SIP OF WATER: Amlodipine (Norvasc), Tamulosin (Flomax)                                 You may not have any metal on your body including hair pins and              piercings  Do not wear jewelry, make-up, lotions, powders or perfumes, deodorant                          Men may shave face and neck.   Do not bring valuables to the hospital. [place].  Contacts, dentures or bridgework may not be worn into surgery.  Leave suitcase in the car. After surgery it may be brought to your room.     Patients discharged the day of surgery will not be allowed to drive home. IF YOU ARE HAVING SURGERY AND GOING HOME THE SAME DAY, YOU MUST HAVE AN ADULT TO DRIVE YOU HOME AND BE WITH YOU FOR 24 HOURS. YOU MAY GO HOME BY TAXI OR UBER OR ORTHERWISE, BUT AN ADULT MUST ACCOMPANY YOU HOME AND STAY WITH YOU FOR 24 HOURS.  Name and phone number of your driver: spouseMarland Kitchen Rod Holler  [phone]                Please read over the  following fact sheets you were given: _____________________________________________________________________             [facility] - Preparing for Surgery Before surgery, you can play an important role.  Because skin is not sterile, your skin needs to be as free of germs as possible.  You can reduce the number of germs on your skin by washing with CHG (chlorahexidine gluconate) soap before surgery.  CHG is an antiseptic cleaner which kills germs and bonds with the skin to continue killing germs even after washing. Please DO NOT use if you have an allergy to CHG or antibacterial soaps.  If your skin becomes reddened/irritated stop using the CHG and inform your nurse when you arrive at Short Stay. Do not  shave (including legs and underarms) for at least 48 hours prior to the first CHG shower.  You may shave your face/neck. Please follow these instructions carefully:  1.  Shower with CHG Soap the night before surgery and the  morning of Surgery.  2.  If you choose to wash your hair, wash your hair first as usual with your  normal  shampoo.  3.  After you shampoo, rinse your hair and body thoroughly to remove the  shampoo.                           4.  Use CHG as you would any other liquid soap.  You can apply chg directly  to the skin and wash                       Gently with a scrungie or clean washcloth.  5.  Apply the CHG Soap to your body ONLY FROM THE NECK DOWN.   Do not use on face/ open                           Wound or open sores. Avoid contact with eyes, ears mouth and genitals (private parts).                       Wash face,  Genitals (private parts) with your normal soap.             6.  Wash thoroughly, paying special attention to the area where your surgery  will be performed.  7.  Thoroughly rinse your body with warm water from the neck down.  8.  DO NOT shower/wash with your normal soap after using and rinsing off  the CHG Soap.                9.  Pat yourself dry with a clean  towel.            10.  Wear clean pajamas.            11.  Place clean sheets on your bed the night of your first shower and do not  sleep with pets. Day of Surgery : Do not apply any lotions/deodorants the morning of surgery.  Please wear clean clothes to the hospital/surgery center.  FAILURE TO FOLLOW THESE INSTRUCTIONS MAY RESULT IN THE CANCELLATION OF YOUR SURGERY PATIENT SIGNATURE_________________________________  NURSE SIGNATURE__________________________________  ________________________________________________________________________

## 2019-11-13 ENCOUNTER — Other Ambulatory Visit (HOSPITAL_COMMUNITY)
Admission: RE | Admit: 2019-11-13 | Discharge: 2019-11-13 | Disposition: A | Discharge status: Home / Self Care | Payer: Medicare Other | Source: Ambulatory Visit | Attending: Urology | Admitting: Urology

## 2019-11-13 DIAGNOSIS — Z01812 Encounter for preprocedural laboratory examination: Secondary | ICD-10-CM | POA: Diagnosis present

## 2019-11-13 DIAGNOSIS — Z20828 Contact with and (suspected) exposure to other viral communicable diseases: Secondary | ICD-10-CM | POA: Insufficient documentation

## 2019-11-15 ENCOUNTER — Encounter (HOSPITAL_COMMUNITY)
Admission: RE | Admit: 2019-11-15 | Discharge: 2019-11-15 | Disposition: A | Discharge status: Home / Self Care | Payer: Medicare Other | Source: Ambulatory Visit | Attending: Urology | Admitting: Urology

## 2019-11-15 ENCOUNTER — Encounter (HOSPITAL_COMMUNITY): Payer: Self-pay

## 2019-11-15 ENCOUNTER — Encounter (HOSPITAL_COMMUNITY): Payer: Self-pay | Admitting: *Deleted

## 2019-11-15 ENCOUNTER — Other Ambulatory Visit: Payer: Self-pay

## 2019-11-15 DIAGNOSIS — R338 Other retention of urine: Secondary | ICD-10-CM | POA: Insufficient documentation

## 2019-11-15 DIAGNOSIS — Z01812 Encounter for preprocedural laboratory examination: Secondary | ICD-10-CM | POA: Insufficient documentation

## 2019-11-15 DIAGNOSIS — N401 Enlarged prostate with lower urinary tract symptoms: Secondary | ICD-10-CM | POA: Diagnosis not present

## 2019-11-15 LAB — CBC
HCT: 46.6 % (ref 39.0–52.0)
Hemoglobin: 15.3 g/dL (ref 13.0–17.0)
MCH: 33.3 pg (ref 26.0–34.0)
MCHC: 32.8 g/dL (ref 30.0–36.0)
MCV: 101.3 fL — ABNORMAL HIGH (ref 80.0–100.0)
Platelets: 201 10*3/uL (ref 150–400)
RBC: 4.6 MIL/uL (ref 4.22–5.81)
RDW: 13.5 % (ref 11.5–15.5)
WBC: 6.8 10*3/uL (ref 4.0–10.5)
nRBC: 0 % (ref 0.0–0.2)

## 2019-11-15 LAB — BASIC METABOLIC PANEL
Anion gap: 9 (ref 5–15)
BUN: 19 mg/dL (ref 8–23)
CO2: 27 mmol/L (ref 22–32)
Calcium: 10.6 mg/dL — ABNORMAL HIGH (ref 8.9–10.3)
Chloride: 106 mmol/L (ref 98–111)
Creatinine, Ser: 1.08 mg/dL (ref 0.61–1.24)
GFR calc Af Amer: >60 mL/min (ref >60)
GFR calc non Af Amer: >60 mL/min (ref >60)
Glucose, Bld: 128 mg/dL — ABNORMAL HIGH (ref 70–99)
Potassium: 4 mmol/L (ref 3.5–5.1)
Sodium: 142 mmol/L (ref 135–145)

## 2019-11-15 LAB — NOVEL CORONAVIRUS, NAA (HOSP ORDER, SEND-OUT TO REF LAB; TAT 18-24 HRS): SARS-CoV-2, NAA: NOT DETECTED

## 2019-11-15 NOTE — Progress Notes (Signed)
Spoke with patient wife ruth, aware patient surgery moved to [facility], arrive 830 am 11-17-2019, follow all other instructions give at pre op.

## 2019-11-15 NOTE — Progress Notes (Signed)
PCP - Dr.Ronald Polite Cardiologist - N/A  Chest x-ray - 12/13/2018 1 view EPIC EKG - 08/30/2019 EPIC Stress Test - n/a ECHO -n/a  Cardiac Cath - n/a  Sleep Study - n/a CPAP - n/a  Fasting Blood Sugar -n/a  Checks Blood Sugar _____ times a day  Blood Thinner Instructions:n/a Aspirin Instructions:Aspirin 81 mg . Prescribed by Dr. Delfina Redwood as preventative . Instructed patient to call Dr. Delfina Redwood and get instructions today about his Aspirin (Whether to stop it or continue it). Patient verbalized understanding and instructions written on pre-op instructions.  Last Dose:  Anesthesia review:   Patient has a history of Colon cancer, Prostate cancer, HTN and PE.  Patient denies shortness of breath, fever, cough and chest pain at PAT appointment   Patient verbalized understanding of instructions that were given to them at the PAT appointment. Patient was also instructed that they will need to review over the PAT instructions again at home before surgery.

## 2019-11-17 ENCOUNTER — Other Ambulatory Visit: Payer: Self-pay

## 2019-11-17 ENCOUNTER — Ambulatory Visit (HOSPITAL_BASED_OUTPATIENT_CLINIC_OR_DEPARTMENT_OTHER)
Admission: RE | Admit: 2019-11-17 | Discharge: 2019-11-17 | Disposition: A | Discharge status: Home / Self Care | Payer: Medicare Other | Attending: Urology | Admitting: Urology

## 2019-11-17 ENCOUNTER — Encounter (HOSPITAL_BASED_OUTPATIENT_CLINIC_OR_DEPARTMENT_OTHER): Payer: Self-pay | Admitting: *Deleted

## 2019-11-17 ENCOUNTER — Ambulatory Visit (HOSPITAL_BASED_OUTPATIENT_CLINIC_OR_DEPARTMENT_OTHER): Payer: Medicare Other | Admitting: Anesthesiology

## 2019-11-17 ENCOUNTER — Encounter (HOSPITAL_BASED_OUTPATIENT_CLINIC_OR_DEPARTMENT_OTHER)
Admission: RE | Disposition: A | Discharge status: Home / Self Care | Payer: Self-pay | Source: Home / Self Care | Attending: Urology

## 2019-11-17 ENCOUNTER — Ambulatory Visit (HOSPITAL_BASED_OUTPATIENT_CLINIC_OR_DEPARTMENT_OTHER): Payer: Medicare Other | Admitting: Physician Assistant

## 2019-11-17 DIAGNOSIS — Z85038 Personal history of other malignant neoplasm of large intestine: Secondary | ICD-10-CM | POA: Diagnosis not present

## 2019-11-17 DIAGNOSIS — Z86711 Personal history of pulmonary embolism: Secondary | ICD-10-CM | POA: Insufficient documentation

## 2019-11-17 DIAGNOSIS — N401 Enlarged prostate with lower urinary tract symptoms: Secondary | ICD-10-CM | POA: Insufficient documentation

## 2019-11-17 DIAGNOSIS — I10 Essential (primary) hypertension: Secondary | ICD-10-CM | POA: Diagnosis not present

## 2019-11-17 HISTORY — PX: TRANSURETHRAL RESECTION OF PROSTATE: SHX73

## 2019-11-17 SURGERY — TURP (TRANSURETHRAL RESECTION OF PROSTATE)
Anesthesia: General

## 2019-11-17 MED ORDER — PHENYLEPHRINE 40 MCG/ML (10ML) SYRINGE FOR IV PUSH (FOR BLOOD PRESSURE SUPPORT)
PREFILLED_SYRINGE | INTRAVENOUS | Status: AC
  Filled 2019-11-17: qty 10

## 2019-11-17 MED ORDER — PROPOFOL 10 MG/ML IV BOLUS
INTRAVENOUS | Status: DC | PRN
  Administered 2019-11-17: 60 mg via INTRAVENOUS
  Administered 2019-11-17: 20 mg via INTRAVENOUS
  Administered 2019-11-17: 30 mg via INTRAVENOUS
  Administered 2019-11-17: 20 mg via INTRAVENOUS

## 2019-11-17 MED ORDER — OXYBUTYNIN CHLORIDE 5 MG PO TABS: 5.0000 mg | ORAL_TABLET | Freq: Three times a day (TID) | ORAL | 0 refills | Status: AC

## 2019-11-17 MED ORDER — PHENYLEPHRINE 40 MCG/ML (10ML) SYRINGE FOR IV PUSH (FOR BLOOD PRESSURE SUPPORT)
PREFILLED_SYRINGE | INTRAVENOUS | Status: DC | PRN
  Administered 2019-11-17 (×2): 120 ug via INTRAVENOUS

## 2019-11-17 MED ORDER — ACETAMINOPHEN 500 MG PO TABS
ORAL_TABLET | ORAL | Status: AC
  Filled 2019-11-17: qty 2

## 2019-11-17 MED ORDER — TRAMADOL HCL 50 MG PO TABS: 50.0000 mg | ORAL_TABLET | Freq: Four times a day (QID) | ORAL | 0 refills | Status: AC | PRN

## 2019-11-17 MED ORDER — CIPROFLOXACIN HCL 500 MG PO TABS: 500.0000 mg | ORAL_TABLET | Freq: Two times a day (BID) | ORAL | 0 refills | Status: AC

## 2019-11-17 MED ORDER — FENTANYL CITRATE (PF) 100 MCG/2ML IJ SOLN
INTRAMUSCULAR | Status: AC
  Filled 2019-11-17: qty 2

## 2019-11-17 MED ORDER — CIPROFLOXACIN HCL 500 MG PO TABS: 500.0000 mg | ORAL_TABLET | Freq: Two times a day (BID) | ORAL | 0 refills | Status: DC

## 2019-11-17 MED ORDER — LACTATED RINGERS IV SOLN
INTRAVENOUS | Status: DC
  Administered 2019-11-17: 09:00:00 via INTRAVENOUS
  Filled 2019-11-17: qty 1000

## 2019-11-17 MED ORDER — FENTANYL CITRATE (PF) 100 MCG/2ML IJ SOLN
25.0000 ug | INTRAMUSCULAR | Status: DC | PRN
  Filled 2019-11-17: qty 1

## 2019-11-17 MED ORDER — CEFAZOLIN SODIUM-DEXTROSE 2-4 GM/100ML-% IV SOLN
INTRAVENOUS | Status: AC
  Filled 2019-11-17: qty 100

## 2019-11-17 MED ORDER — BELLADONNA ALKALOIDS-OPIUM 16.2-60 MG RE SUPP
RECTAL | Status: DC | PRN
  Administered 2019-11-17: 1 via RECTAL

## 2019-11-17 MED ORDER — FENTANYL CITRATE (PF) 100 MCG/2ML IJ SOLN
INTRAMUSCULAR | Status: DC | PRN
  Administered 2019-11-17 (×2): 25 ug via INTRAVENOUS

## 2019-11-17 MED ORDER — ONDANSETRON HCL 4 MG/2ML IJ SOLN
INTRAMUSCULAR | Status: DC | PRN
  Administered 2019-11-17: 4 mg via INTRAVENOUS

## 2019-11-17 MED ORDER — ACETAMINOPHEN 500 MG PO TABS
1000.0000 mg | ORAL_TABLET | Freq: Once | ORAL | Status: AC
  Administered 2019-11-17: 1000 mg via ORAL
  Filled 2019-11-17: qty 2

## 2019-11-17 MED ORDER — LIDOCAINE 2% (20 MG/ML) 5 ML SYRINGE
INTRAMUSCULAR | Status: DC | PRN
  Administered 2019-11-17: 40 mg via INTRAVENOUS

## 2019-11-17 MED ORDER — CEFAZOLIN SODIUM-DEXTROSE 2-4 GM/100ML-% IV SOLN
2.0000 g | Freq: Once | INTRAVENOUS | Status: AC
  Administered 2019-11-17: 11:00:00 2 g via INTRAVENOUS
  Filled 2019-11-17: qty 100

## 2019-11-17 MED ORDER — PROPOFOL 10 MG/ML IV BOLUS
INTRAVENOUS | Status: AC
  Filled 2019-11-17: qty 20

## 2019-11-17 MED ORDER — BELLADONNA ALKALOIDS-OPIUM 16.2-60 MG RE SUPP
RECTAL | Status: AC
  Filled 2019-11-17: qty 1

## 2019-11-17 MED ORDER — LIDOCAINE 2% (20 MG/ML) 5 ML SYRINGE
INTRAMUSCULAR | Status: AC
  Filled 2019-11-17: qty 5

## 2019-11-17 MED ORDER — OXYBUTYNIN CHLORIDE 5 MG PO TABS: 5.0000 mg | ORAL_TABLET | Freq: Three times a day (TID) | ORAL | 0 refills | Status: DC

## 2019-11-17 MED ORDER — SENNA 8.6 MG PO TABS: 1.0000 | ORAL_TABLET | Freq: Every day | ORAL | 0 refills | Status: AC | PRN

## 2019-11-17 SURGICAL SUPPLY — 20 items
BAG DRAIN URO-CYSTO SKYTR STRL (DRAIN) ×3 IMPLANT
BAG DRN RND TRDRP ANRFLXCHMBR (UROLOGICAL SUPPLIES) ×1
BAG DRN UROCATH (DRAIN) ×1
BAG URINE DRAIN 2000ML AR STRL (UROLOGICAL SUPPLIES) ×3 IMPLANT
CATH FOLEY 3WAY 30CC 22F (CATHETERS) IMPLANT
CATH FOLEY 3WAY 30CC 24FR (CATHETERS) ×3
CATH URTH STD 24FR FL 3W 2 (CATHETERS) IMPLANT
GLOVE BIO SURGEON STRL SZ7.5 (GLOVE) ×3 IMPLANT
GOWN STRL REUS W/TWL XL LVL3 (GOWN DISPOSABLE) ×3 IMPLANT
HOLDER FOLEY CATH W/STRAP (MISCELLANEOUS) ×2 IMPLANT
IV NS IRRIG 3000ML ARTHROMATIC (IV SOLUTION) ×12 IMPLANT
LOOP CUT BIPOLAR 24F LRG (ELECTROSURGICAL) ×2 IMPLANT
MANIFOLD NEPTUNE II (INSTRUMENTS) ×3 IMPLANT
PACK CYSTO (CUSTOM PROCEDURE TRAY) ×3 IMPLANT
PIN SAFETY STERILE (MISCELLANEOUS) ×3 IMPLANT
RUBBERBAND STERILE (MISCELLANEOUS) ×2 IMPLANT
SYRINGE IRR TOOMEY STRL 70CC (SYRINGE) ×3 IMPLANT
TUBE CONNECTING 12'X1/4 (SUCTIONS) ×1
TUBE CONNECTING 12X1/4 (SUCTIONS) ×2 IMPLANT
TUBING UROLOGY SET (TUBING) ×3 IMPLANT

## 2019-11-17 NOTE — Anesthesia Procedure Notes (Signed)
Procedure Name: LMA Insertion Date/Time: 11/17/2019 10:55 AM Performed by: Niel Hummer, CRNA Pre-anesthesia Checklist: Patient identified, Emergency Drugs available, Suction available and Patient being monitored Patient Re-evaluated:Patient Re-evaluated prior to induction Oxygen Delivery Method: Circle system utilized Preoxygenation: Pre-oxygenation with 100% oxygen Induction Type: IV induction LMA: LMA inserted LMA Size: 4.0 Dental Injury: Teeth and Oropharynx as per pre-operative assessment

## 2019-11-17 NOTE — Transfer of Care (Signed)
Immediate Anesthesia Transfer of Care Note  Patient: Pedro Palmer  Procedure(s) Performed: TRANSURETHRAL RESECTION OF THE PROSTATE (TURP), BIPOLAR (N/A )  Patient Location: PACU  Anesthesia Type:General  Level of Consciousness: awake  Airway & Oxygen Therapy: Patient Spontanous Breathing and Patient connected to face mask oxygen  Post-op Assessment: Report given to RN, Post -op Vital signs reviewed and stable and Patient moving all extremities X 4  Post vital signs: Reviewed and stable  Last Vitals:  Vitals Value Taken Time  BP    Temp    Pulse 62 11/17/19 1205  Resp 15 11/17/19 1205  SpO2 100 % 11/17/19 1205  Vitals shown include unvalidated device data.  Last Pain:  Vitals:   11/17/19 0901  TempSrc: Oral         Complications: No apparent anesthesia complications

## 2019-11-17 NOTE — Anesthesia Preprocedure Evaluation (Addendum)
Anesthesia Evaluation  Patient identified by MRN, date of birth, ID band Patient awake    Reviewed: Allergy & Precautions, NPO status , Patient's Chart, lab work & pertinent test results  Airway Mallampati: I  TM Distance: >3 FB Neck ROM: Full    Dental  (+) Edentulous Upper, Edentulous Lower   Pulmonary PE (2012, not on chronic anticoagulation)   Pulmonary exam normal breath sounds clear to auscultation       Cardiovascular hypertension, Pt. on medications negative cardio ROS Normal cardiovascular exam Rhythm:Regular Rate:Normal  HLD   Neuro/Psych negative neurological ROS  negative psych ROS   GI/Hepatic negative GI ROS, Neg liver ROS,   Endo/Other  negative endocrine ROS  Renal/GU negative Renal ROS  negative genitourinary   Musculoskeletal  (+) Arthritis ,   Abdominal   Peds  Hematology negative hematology ROS (+)   Anesthesia Other Findings   Reproductive/Obstetrics                           Anesthesia Physical Anesthesia Plan  ASA: II  Anesthesia Plan: General   Post-op Pain Management:    Induction: Intravenous  PONV Risk Score and Plan: 2 and Ondansetron and Dexamethasone  Airway Management Planned: LMA  Additional Equipment:   Intra-op Plan:   Post-operative Plan: Extubation in OR  Informed Consent: I have reviewed the patients History and Physical, chart, labs and discussed the procedure including the risks, benefits and alternatives for the proposed anesthesia with the patient or authorized representative who has indicated his/her understanding and acceptance.     Dental advisory given  Plan Discussed with: CRNA  Anesthesia Plan Comments:         Anesthesia Quick Evaluation

## 2019-11-17 NOTE — H&P (Signed)
Urology Preoperative H&P   Chief Complaint: Urinary retention  History of Present Illness: Pedro Palmer is a 85 y.o. male with a history of BPH and urinary retention requiring an indwelling Foley catheter after failing multiple voiding trials despite tamsulosin BID.  He underwent UDS,which showed a low capacity bladder with unstable detrusor contractions during the filling phase along with an obstructed flow pattern and a large bladder diverticulum.  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 No other masses or lesions were noted. His prostate was moderately obstructing.   Past Medical History:  Diagnosis Date  . Arthritis   . Colon cancer ([place]) dx'd 04/2010  . Hypercholesteremia   . Hypertension   . Paget disease of bone   . Pulmonary emboli ([place]) 2011 or 2012    Past Surgical History:  Procedure Laterality Date  . APPENDECTOMY    . COLONOSCOPY WITH PROPOFOL N/A 07/25/2014   Procedure: COLONOSCOPY WITH PROPOFOL;  Surgeon: Garlan Fair, MD;  Location: [facility];  Service: Endoscopy;  Laterality: N/A;  . [place]  . KELOID EXCISION    . LEFT COLECTOMY  07/03/2010    Allergies: No Known Allergies  No family history on file.  Social History:  reports that he has never smoked. He has never used smokeless tobacco. He reports that he does not drink alcohol or use drugs.  ROS: A complete review of systems was performed.  All systems are negative except for pertinent findings as noted.  Physical Exam:  Vital signs in last 24 hours:   Constitutional:  Alert and oriented, No acute distress Cardiovascular: Regular rate and rhythm, No JVD Respiratory: Normal respiratory effort, Lungs clear bilaterally GI: Abdomen is soft, nontender, nondistended, no abdominal masses GU: No CVA tenderness Lymphatic: No lymphadenopathy Neurologic: Grossly intact, no focal  deficits Psychiatric: Normal mood and affect  Laboratory Data:  Recent Labs    11/15/19 1511  WBC 6.8  HGB 15.3  HCT 46.6  PLT 201    Recent Labs    11/15/19 1511  NA 142  K 4.0  CL 106  GLUCOSE 128*  BUN 19  CALCIUM 10.6*  CREATININE 1.08     No results found for this or any previous visit (from the past 24 hour(s)). Recent Results (from the past 240 hour(s))  Novel Coronavirus, NAA (Hosp order, Send-out to Ref Lab; TAT 18-24 hrs     Status: None   Collection Time: 11/13/19 11:19 AM   Specimen: Nasopharyngeal Swab; Respiratory  Result Value Ref Range Status   SARS-CoV-2, NAA NOT DETECTED NOT DETECTED Final    Comment: (NOTE) This nucleic acid amplification test was developed and its performance characteristics determined by [employer]. Nucleic acid amplification tests include PCR and TMA. This test has not been FDA cleared or approved. This test has been authorized by FDA under an Emergency Use Authorization (EUA). This test is only authorized for the duration of time the declaration that circumstances exist justifying the authorization of the emergency use of in vitro diagnostic tests for detection of SARS-CoV-2 virus and/or diagnosis of COVID-19 infection under section 564(b)(1) of the Act, 21 U.S.C. [id number]) (1), unless the authorization is terminated or revoked sooner. When diagnostic testing is negative, the possibility of a false negative result should be considered in the context of a patient's recent exposures and the presence of clinical signs and symptoms consistent with COVID-19. An individual without symptoms of COVID- 19 and  who is not shedding SARS-CoV-2 vi rus would expect to have a negative (not detected) result in this assay. Performed At: [facility] [address] [place], [place] [id number] Rush Farmer MD [id number]    [place]  Final    Comment: Performed at [facility],  [place] [address] [place], [place] [address]    Renal Function: Recent Labs    11/15/19 1511  CREATININE 1.08   Estimated Creatinine Clearance: 47.6 mL/min (by C-G formula based on SCr of 1.08 mg/dL).  Radiologic Imaging: No results found.  I independently reviewed the above imaging studies.  Assessment and Plan Pedro Palmer is a 85 y.o. male with BPH and urinary retention   -The risks, benefits and alternatives of cystoscopy with TURP was discussed with the patient. The risks included, but are not limited to, bleeding, urinary tract infection, bladder perforation requiring prolonged catheterization and/or open bladder repair, ureteral injury, ureteral obstruction, urethral stricture disease, new or worsening voiding dysfunction, retrograde ejaculation, MI, CVA, PE, DVT and the inherent risks of general anesthesia. We also discussed the need for Foley catheterization for at least 3 days post-op and the likely need for post-op observation in the hospital following the procedure. The patient voices understanding and wishes to proceed.    Ellison Hughs, MD 11/17/2019, 8:03 AM  [facility] Pager: [phone]

## 2019-11-17 NOTE — Anesthesia Postprocedure Evaluation (Signed)
Anesthesia Post Note  Patient: Pedro Palmer  Procedure(s) Performed: TRANSURETHRAL RESECTION OF THE PROSTATE (TURP), BIPOLAR (N/A )     Patient location during evaluation: PACU Anesthesia Type: General Level of consciousness: awake and alert Pain management: pain level controlled Vital Signs Assessment: post-procedure vital signs reviewed and stable Respiratory status: spontaneous breathing, nonlabored ventilation, respiratory function stable and patient connected to nasal cannula oxygen Cardiovascular status: blood pressure returned to baseline and stable Postop Assessment: no apparent nausea or vomiting Anesthetic complications: no    Last Vitals:  Vitals:   11/17/19 1245 11/17/19 1300  BP: (!) 154/79 (!) 151/92  Pulse: 67 74  Resp: 11 11  Temp:    SpO2: 100% 97%    Last Pain:  Vitals:   11/17/19 1315  TempSrc:   PainSc: 0-No pain                 Doris Mcgilvery L Prather Failla

## 2019-11-17 NOTE — Discharge Instructions (Signed)
1. You may see some blood in the urine and may have some burning around the penis or pelvis for 48-72 hours. You also may notice that you have the sensation to urinate more frequently or urgently after your procedure which is normal.  2. You should call should you develop an inability urinate, fever > 101, persistent nausea and vomiting that prevents you from eating or drinking to stay hydrated.  3. You have a catheter.  You may periodically feel a strong urge to void with the catheter in place.  This is a bladder spasm and most often can occur when having a bowel movement or moving around. It is typically self-limited and usually will stop after a few minutes.  You may use some Vaseline or Neosporin around the tip of the catheter to reduce friction at the tip of the penis. You may also see some blood in the urine.  A very small amount of blood can make the urine look quite red.  As long as the catheter is draining well, there usually is not a problem.  However, if the catheter is not draining well and is bloody, you should call the office [phone]) to notify us.  Indwelling Urinary Catheter Care, Adult An indwelling urinary catheter is a thin tube that is put into your bladder. The tube helps to drain pee (urine) out of your body. The tube goes in through your urethra. Your urethra is where pee comes out of your body. Your pee will come out through the catheter, then it will go into a bag (drainage bag). Take good care of your catheter so it will work well.  How to wear your catheter and bag Supplies needed        .  leg strap  Large overnight bag.  Smaller bag (leg bag).  Wearing your catheter Attach your catheter to your leg with  a leg strap.  Make sure the catheter is not pulled tight.  If a leg strap gets wet, take it off and put on a dry strap.   Wearing your bags You should have been given a large overnight bag.  You may wear the overnight bag in the day or night.  Always  have the overnight bag lower than your bladder.  Do not let the bag touch the floor.  Before you go to sleep, put a clean plastic bag in a wastebasket. Then hang the overnight bag inside the wastebasket. You should also have a smaller leg bag that fits under your clothes.  Always wear the leg bag below your knee.  Do not wear your leg bag at night. How to care for your skin and catheter Supplies needed  A clean washcloth.  Water and mild soap.  A clean towel. Caring for your skin and catheter      Clean the skin around your catheter every day: ? Wash your hands with soap and water. ? Wet a clean washcloth in warm water and mild soap. ? Clean the skin around your urethra. ? If you are male: ? Gently spread the folds of skin around your vagina (labia). ? With the washcloth in your other hand, wipe the inner side of your labia on each side. Wipe from front to back. ? If you are male: ? Pull back any skin that covers the end of your penis (foreskin). ? With the washcloth in your other hand, wipe your penis in small circles. Start wiping at the tip of your penis, then move away  from the catheter. ? Move the foreskin back in place, if needed. ? With your free hand, hold the catheter close to where it goes into your body. ? Keep holding the catheter during cleaning so it does not get pulled out. ? With the washcloth in your other hand, clean the catheter. ? Only wipe downward on the catheter. ? Do not wipe upward toward your body. Doing this may push germs into your urethra and cause infection. ? Use a clean towel to pat-dry the catheter and the skin around it. Make sure to wipe off all soap. ? Wash your hands with soap and water.  Shower every day. Do not take baths.  Do not use cream, ointment, or lotion on the area where the catheter goes into your body, unless your doctor tells you to.  Do not use powders, sprays, or lotions on your genital area.  Check your skin around  the catheter every day for signs of infection. Check for: ? Redness, swelling, or pain. ? Fluid or blood. ? Warmth. ? Pus or a bad smell. How to empty the bag Supplies needed   Tape or a leg strap.   Emptying the bag Pour the pee out of your bag when it is ?- full, or at least 2-3 times a day. Do this for your overnight bag and your leg bag. 1. Wash your hands with soap and water. 2. Separate (detach) the bag from your leg. 3. Hold the bag over the toilet or a clean pail. Keep the bag lower than your hips and bladder. This is so the pee (urine) does not go back into the tube. 4. Open the pour spout. It is at the bottom of the bag. 5. Empty the pee into the toilet or pail. Do not let the pour spout touch any surface. 6. Put rubbing alcohol on a gauze pad or cotton ball. 7. Use the gauze pad or cotton ball to clean the pour spout. 8. Close the pour spout. 9. Attach the bag to your leg with tape or a leg strap. 10. Wash your hands with soap and water. Follow instructions for cleaning the drainage bag:  From the product maker.  As told by your doctor. How to change the bag Supplies needed  A clean bag.   leg strap. Changing the bag Replace your bag when it starts to leak, smell bad, or look dirty. 1. Wash your hands with soap and water. 2. Separate the dirty bag from your leg. 3. Pinch the catheter with your fingers so that pee does not spill out. 4. Separate the catheter tube from the bag tube where these tubes connect (at the connection valve). Do not let the tubes touch any surface. 5. Clean the end of the catheter tube with an alcohol wipe. Use a different alcohol wipe to clean the end of the bag tube. 6. Connect the catheter tube to the tube of the clean bag. 7. Attach the clean bag to your leg with tape or a leg strap. Do not make the bag tight on your leg. 8. Wash your hands with soap and water. General rules   Never pull on your catheter. Never try to take it out.  Doing that can hurt you.  Always wash your hands before and after you touch your catheter or bag. Use a mild, fragrance-free soap. If you do not have soap and water, use hand sanitizer.  Always make sure there are no twists or bends (kinks) in the catheter  tube.  Always make sure there are no leaks in the catheter or bag.  Drink enough fluid to keep your pee pale yellow.  Do not take baths, swim, or use a hot tub.  If you are male, wipe from front to back after you poop (have a bowel movement). Contact a doctor if:  Your pee is cloudy.  Your pee smells worse than usual.  Your catheter gets clogged.  Your catheter leaks.  Your bladder feels full. Get help right away if:  You have redness, swelling, or pain where the catheter goes into your body.  You have fluid, blood, pus, or a bad smell coming from the area where the catheter goes into your body.  Your skin feels warm where the catheter goes into your body.  You have a fever.  You have pain in your: ? Belly (abdomen). ? Legs. ? Lower back. ? Bladder.  You see blood in the catheter.  Your pee is pink or red.  You feel sick to your stomach (nauseous).  You throw up (vomit).  You have chills.  Your pee is not draining into the bag.  Your catheter gets pulled out. Summary  An indwelling urinary catheter is a thin tube that is placed into the bladder to help drain pee (urine) out of the body.  The catheter is placed into the part of the body that drains pee from the bladder (urethra).  Taking good care of your catheter will keep it working properly and help prevent problems.  Always wash your hands before and after touching your catheter or bag.  Never pull on your catheter or try to take it out. This information is not intended to replace advice given to you by your health care provider. Make sure you discuss any questions you have with your health care provider. Document Released: 03/22/2013 Document  Revised: 03/19/2019 Document Reviewed: 07/11/2017 Elsevier Patient Education  [place] Instructions  Activity: Get plenty of rest for the remainder of the day. A responsible individual must stay with you for 24 hours following the procedure.  For the next 24 hours, DO NOT: -Drive a car -Paediatric nurse -Drink alcoholic beverages -Take any medication unless instructed by your physician -Make any legal decisions or sign important papers.  Meals: Start with liquid foods such as gelatin or soup. Progress to regular foods as tolerated. Avoid greasy, spicy, heavy foods. If nausea and/or vomiting occur, drink only clear liquids until the nausea and/or vomiting subsides. Call your physician if vomiting continues.  Special Instructions/Symptoms: Your throat may feel dry or sore from the anesthesia or the breathing tube placed in your throat during surgery. If this causes discomfort, gargle with warm salt water. The discomfort should disappear within 24 hours.  If you had a scopolamine patch placed behind your ear for the management of post- operative nausea and/or vomiting:  1. The medication in the patch is effective for 72 hours, after which it should be removed.  Wrap patch in a tissue and discard in the trash. Wash hands thoroughly with soap and water. 2. You may remove the patch earlier than 72 hours if you experience unpleasant side effects which may include dry mouth, dizziness or visual disturbances. 3. Avoid touching the patch. Wash your hands with soap and water after contact with the patch.

## 2019-11-17 NOTE — Op Note (Signed)
Operative Note  Preoperative diagnosis:  1.  BPH with bladder outlet obstruction  Postoperative diagnosis: 1.  BPH with bladder outlet obstruction  Procedure(s): 1.  Bipolar TURP  Surgeon: Ellison Hughs, MD  Assistants: Sharlot Gowda, MD PGY 4  Anesthesia:  General  Complications:  None  EBL: 50 mL  Specimens: 1. Prostate chips  Drains/Catheters: 1.  24 French three-way Foley catheter with 30 mL in the balloon  Intraoperative findings:   1. Bilobar prostatic urethral obstruction 2. 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 male with a history of BPH with urinary retention requiring an indwelling Foley catheter.  The patient has failed multiple voiding trials despite tamsulosin twice daily.  He underwent UDS that showed a low capacity bladder with adequate detrusor function and had obstructive flow pattern.   Description of procedure:  After informed consent was obtained, the patient was brought to the operating room and general anesthesia was administered. The patient was then placed in the dorsolithotomy position and prepped and draped in usual sterile fashion. A timeout was performed. A 23 French rigid cystoscope was then inserted into the urethral meatus and advanced into the bladder under direct vision. A complete bladder survey revealed no intravesical pathology.  Both ureteral orifices were identified and well away from the bladder neck.  The rigid cystoscope was then exchanged for a 26 French resectoscope with a bipolar loop working element.  Starting at the bladder neck and progressing distally to the verumontanum, the prostatic adenoma was systematically resected until a widely patent prostatic urethral channel was created.  All prostate chips were then hand irrigated out of the bladder and sent to pathology for permanent section.  The resectoscope was then removed and exchanged for a 24  [place] three-way Foley  catheter.  The three-way Foley catheter was then extensively hand irrigated until the irrigant returned clear to light pink.  The catheter was then placed to continuous bladder irrigation and placed on rubber band traction.  He tolerated the procedure well and was transferred to the postanesthesia unit in stable condition.  Plan:  CBI in the PACU.  Discharge home with a Foley catheter

## 2019-11-18 LAB — SURGICAL PATHOLOGY

## 2020-03-25 ENCOUNTER — Ambulatory Visit: Payer: Medicare Other | Attending: Internal Medicine

## 2020-03-25 DIAGNOSIS — Z23 Encounter for immunization: Secondary | ICD-10-CM

## 2020-03-25 NOTE — Progress Notes (Signed)
   Covid-19 Vaccination Clinic  Name:  Pedro Palmer    MRN: [id number] DOB: [birth year]  03/25/2020  [name] was observed post Covid-19 immunization for 15 minutes without incident. He was provided with Vaccine Information Sheet and instruction to access the V-Safe system.   [name] was instructed to call 911 with any severe reactions post vaccine: Marland Kitchen Difficulty breathing  . Swelling of face and throat  . A fast heartbeat  . A bad rash all over body  . Dizziness and weakness   Immunizations Administered    Name Date Dose VIS Date Route   Pfizer COVID-19 Vaccine 03/25/2020  4:48 PM 0.3 mL 11/19/2019 Intramuscular   Manufacturer: [place]   Lot: [id number]   [place]: [id number]

## 2020-04-18 ENCOUNTER — Ambulatory Visit: Payer: Medicare Other | Attending: Internal Medicine

## 2020-04-18 DIAGNOSIS — Z23 Encounter for immunization: Secondary | ICD-10-CM

## 2020-04-18 NOTE — Progress Notes (Signed)
   Covid-19 Vaccination Clinic  Name:  Pedro Palmer    MRN: [id number] DOB: 1933/07/24  04/18/2020  Pedro Palmer was observed post Covid-19 immunization for 15 minutes without incident. He was provided with Vaccine Information Sheet and instruction to access the V-Safe system.   Pedro Palmer was instructed to call 911 with any severe reactions post vaccine: Marland Kitchen Difficulty breathing  . Swelling of face and throat  . A fast heartbeat  . A bad rash all over body  . Dizziness and weakness   Immunizations Administered    Name Date Dose VIS Date Route   Pfizer COVID-19 Vaccine 04/18/2020  4:44 PM 0.3 mL 02/02/2019 Intramuscular   Manufacturer: [employer], [employer]   Lot: [id number]   [place]: [id number]

## 2020-05-15 ENCOUNTER — Ambulatory Visit: Payer: Medicare Other | Admitting: Podiatry

## 2020-05-15 ENCOUNTER — Encounter: Payer: Self-pay | Admitting: Podiatry

## 2020-05-15 ENCOUNTER — Other Ambulatory Visit: Payer: Self-pay

## 2020-05-15 DIAGNOSIS — M79675 Pain in left toe(s): Secondary | ICD-10-CM

## 2020-05-15 DIAGNOSIS — B351 Tinea unguium: Secondary | ICD-10-CM

## 2020-05-15 DIAGNOSIS — M79674 Pain in right toe(s): Secondary | ICD-10-CM | POA: Diagnosis not present

## 2020-05-15 NOTE — Patient Instructions (Signed)

## 2020-05-19 NOTE — Progress Notes (Signed)
Subjective: Pedro Palmer is a 86 y.o. male patient seen today painful mycotic nails b/l that are difficult to trim. Pain interferes with ambulation. Aggravating factors include wearing enclosed shoe gear. Pain is relieved with periodic professional debridement.  Patient Active Problem List   Diagnosis Date Noted  . Prostate cancer ([place]) 08/28/2019  . Constipation 08/28/2019  . Sepsis ([place]) 12/13/2018  . Paget disease of bone   . Colon cancer ([place]) 07/21/2012  . HTN (hypertension) 07/21/2012    Current Outpatient Medications on File Prior to Visit  Medication Sig Dispense Refill  . amLODipine (NORVASC) 10 MG tablet Take 1 tablet (10 mg total) by mouth daily. 90 tablet 0  . Ascorbic Acid (VITAMIN C PO) Take 1,000 mg by mouth daily.     Marland Kitchen aspirin EC 81 MG tablet Take 81 mg by mouth daily.    Marland Kitchen lisinopril (PRINIVIL,ZESTRIL) 20 MG tablet Take 1 tablet (20 mg total) by mouth 2 (two) times daily. (Patient not taking: Reported on 11/10/2019)    . meloxicam (MOBIC) 15 MG tablet Take 15 mg by mouth daily as needed for pain.     . Multiple Vitamin (MULTIVITAMIN WITH MINERALS) TABS tablet Take 1 tablet by mouth daily.    Marland Kitchen oxybutynin (DITROPAN) 5 MG tablet Take 5 mg by mouth 3 (three) times daily.    . simvastatin (ZOCOR) 20 MG tablet Take 20 mg by mouth every evening.     . tamsulosin (FLOMAX) 0.4 MG CAPS capsule Take 1 capsule (0.4 mg total) by mouth daily. 30 capsule 0  . traMADol (ULTRAM) 50 MG tablet Take 50 mg by mouth daily as needed.     No current facility-administered medications on file prior to visit.    No Known Allergies  Objective: Physical Exam  General: Patient is a pleasant 86 y.o. African American male in no acute distress, Awake, alert and oriented x 3  Neurovascular status unchanged b/l.  Neurovascular status unchanged b/l lower extremities. Capillary fill time to digits <3 seconds b/l lower extremities. Palpable DP pulses b/l. Palpable PT pulses b/l. Pedal hair absent  b/l. Skin temperature gradient within normal limits b/l.   Protective sensation intact 5/5 intact bilaterally with 10g monofilament b/l. Vibratory sensation intact b/l. Proprioception intact bilaterally.  Dermatological:  Pedal skin with normal turgor, texture and tone bilaterally. No open wounds bilaterally. No interdigital macerations bilaterally. Toenails 1-5 b/l elongated, discolored, dystrophic, thickened, crumbly with subungual debris and tenderness to dorsal palpation.  Musculoskeletal:  Normal muscle strength 5/5 to all lower extremity muscle groups bilaterally. No pain crepitus or joint limitation noted with ROM b/l. No gross bony deformities bilaterally.  Assessment and Plan:  1. Pain due to onychomycosis of toenails of both feet    -Examined patient. -No new findings. No new orders. -Toenails 1-5 b/l were debrided in length and girth with sterile nail nippers and dremel without iatrogenic bleeding.  -Patient to report any pedal injuries to medical professional immediately. -Patient to continue soft, supportive shoe gear daily. -Patient/POA to call should there be question/concern in the interim.  Return in about 3 months (around 08/15/2020) for nail trim.  Marzetta Board, DPM

## 2020-08-23 ENCOUNTER — Ambulatory Visit (INDEPENDENT_AMBULATORY_CARE_PROVIDER_SITE_OTHER): Payer: Medicare Other | Admitting: Podiatry

## 2020-08-23 ENCOUNTER — Encounter: Payer: Self-pay | Admitting: Podiatry

## 2020-08-23 ENCOUNTER — Other Ambulatory Visit: Payer: Self-pay

## 2020-08-23 DIAGNOSIS — M79674 Pain in right toe(s): Secondary | ICD-10-CM | POA: Diagnosis not present

## 2020-08-23 DIAGNOSIS — B351 Tinea unguium: Secondary | ICD-10-CM

## 2020-08-23 DIAGNOSIS — M79675 Pain in left toe(s): Secondary | ICD-10-CM

## 2020-08-27 NOTE — Progress Notes (Signed)
Subjective: Pedro Palmer is a 86 y.o. male patient seen today painful mycotic nails b/l that are difficult to trim. Pain interferes with ambulation. Aggravating factors include wearing enclosed shoe gear. Pain is relieved with periodic professional debridement. His wife is present during today's visit. Pedro Palmer states his left great toe is most symptomatic on today's visit. He is requesting to be seen sooner than 3 months.   Patient Active Problem List   Diagnosis Date Noted  . Prostate cancer ([place]) 08/28/2019  . Constipation 08/28/2019  . Sepsis ([place]) 12/13/2018  . Paget disease of bone   . Colon cancer ([place]) 07/21/2012  . HTN (hypertension) 07/21/2012    Current Outpatient Medications on File Prior to Visit  Medication Sig Dispense Refill  . amLODipine (NORVASC) 10 MG tablet Take 1 tablet (10 mg total) by mouth daily. 90 tablet 0  . Ascorbic Acid (VITAMIN C PO) Take 1,000 mg by mouth daily.     Marland Kitchen aspirin EC 81 MG tablet Take 81 mg by mouth daily.    Marland Kitchen lisinopril (PRINIVIL,ZESTRIL) 20 MG tablet Take 1 tablet (20 mg total) by mouth 2 (two) times daily.    . meloxicam (MOBIC) 15 MG tablet Take 15 mg by mouth daily as needed for pain.     . Multiple Vitamin (MULTIVITAMIN WITH MINERALS) TABS tablet Take 1 tablet by mouth daily.    Marland Kitchen oxybutynin (DITROPAN) 5 MG tablet Take 5 mg by mouth 3 (three) times daily.    . simvastatin (ZOCOR) 20 MG tablet Take 20 mg by mouth every evening.     . tamsulosin (FLOMAX) 0.4 MG CAPS capsule Take 1 capsule (0.4 mg total) by mouth daily. 30 capsule 0  . traMADol (ULTRAM) 50 MG tablet Take 50 mg by mouth daily as needed.     No current facility-administered medications on file prior to visit.    No Known Allergies  Objective: Physical Exam  General: Patient is a pleasant 86 y.o. African American male in no acute distress, Awake, alert and oriented x 3  Neurovascular status unchanged b/l.  Neurovascular status unchanged b/l lower extremities.  Capillary fill time to digits <3 seconds b/l lower extremities. Palpable DP pulses b/l. Palpable PT pulses b/l. Pedal hair absent b/l. Skin temperature gradient within normal limits b/l.   Protective sensation intact 5/5 intact bilaterally with 10g monofilament b/l. Vibratory sensation intact b/l. Proprioception intact bilaterally.  Dermatological:  Pedal skin with normal turgor, texture and tone bilaterally. No open wounds bilaterally. No interdigital macerations bilaterally. Toenails 1-5 b/l elongated, discolored, dystrophic, thickened, crumbly with subungual debris and tenderness to dorsal palpation. Left hallux with pain noted subungually at medial border. No purulence, no erythema, no edema.  Musculoskeletal:  Normal muscle strength 5/5 to all lower extremity muscle groups bilaterally. No pain crepitus or joint limitation noted with ROM b/l. No gross bony deformities bilaterally.  Assessment and Plan:  1. Pain due to onychomycosis of toenails of both feet    -Examined patient. -No new findings. No new orders. -Toenails 1-5 b/l were debrided in length and girth with sterile nail nippers and dremel without iatrogenic bleeding. Left hallux nail debrided and curretaged. Border cleansed with alcohol and triple antibiotic ointment applied. He and his wife were instructed to apply Neosporin to digit once daily for one week. They related understanding. Call office if he encounters any problems. -Patient to report any pedal injuries to medical professional immediately. -Patient to continue soft, supportive shoe gear daily. -Patient/POA to call should there be  question/concern in the interim.  Return in about 10 weeks (around 11/01/2020) for nail trim.  Marzetta Board, DPM

## 2020-11-13 ENCOUNTER — Ambulatory Visit (INDEPENDENT_AMBULATORY_CARE_PROVIDER_SITE_OTHER): Payer: Medicare Other | Admitting: Podiatry

## 2020-11-13 ENCOUNTER — Other Ambulatory Visit: Payer: Self-pay

## 2020-11-13 ENCOUNTER — Encounter: Payer: Self-pay | Admitting: Podiatry

## 2020-11-13 DIAGNOSIS — M79674 Pain in right toe(s): Secondary | ICD-10-CM

## 2020-11-13 DIAGNOSIS — B351 Tinea unguium: Secondary | ICD-10-CM | POA: Diagnosis not present

## 2020-11-13 DIAGNOSIS — M79675 Pain in left toe(s): Secondary | ICD-10-CM

## 2020-11-17 NOTE — Progress Notes (Signed)
Subjective: Pedro Palmer is a pleasant 86 y.o. male patient seen today painful thick toenails that are difficult to trim. Pain interferes with ambulation. Aggravating factors include wearing enclosed shoe gear. Pain is relieved with periodic professional debridement.  His wife is present during today's visit. They voice no new pedal concerns on today's visit.  Past Medical History:  Diagnosis Date  . Arthritis   . Colon cancer ([place]) dx'd 04/2010  . Hypercholesteremia   . Hypertension   . Paget disease of bone   . Pulmonary emboli ([place]) 2011 or 2012    Patient Active Problem List   Diagnosis Date Noted  . Prostate cancer ([place]) 08/28/2019  . Constipation 08/28/2019  . Sepsis ([place]) 12/13/2018  . Paget disease of bone   . Colon cancer ([place]) 07/21/2012  . HTN (hypertension) 07/21/2012    Current Outpatient Medications on File Prior to Visit  Medication Sig Dispense Refill  . amLODipine (NORVASC) 10 MG tablet Take 1 tablet (10 mg total) by mouth daily. 90 tablet 0  . Ascorbic Acid (VITAMIN C PO) Take 1,000 mg by mouth daily.     Marland Kitchen aspirin EC 81 MG tablet Take 81 mg by mouth daily.    Marland Kitchen lisinopril (PRINIVIL,ZESTRIL) 20 MG tablet Take 1 tablet (20 mg total) by mouth 2 (two) times daily.    . meloxicam (MOBIC) 15 MG tablet Take 15 mg by mouth daily as needed for pain.     . Multiple Vitamin (MULTIVITAMIN WITH MINERALS) TABS tablet Take 1 tablet by mouth daily.    Marland Kitchen oxybutynin (DITROPAN) 5 MG tablet Take 5 mg by mouth 3 (three) times daily.    . simvastatin (ZOCOR) 20 MG tablet Take 20 mg by mouth every evening.     . tamsulosin (FLOMAX) 0.4 MG CAPS capsule Take 1 capsule (0.4 mg total) by mouth daily. 30 capsule 0  . traMADol (ULTRAM) 50 MG tablet Take 50 mg by mouth daily as needed.     No current facility-administered medications on file prior to visit.    No Known Allergies  Objective: Physical Exam  General: Pedro Palmer is a pleasant 86 y.o. African American male, in NAD. AAO  x 3.   Vascular:  Capillary refill time to digits <3 seconds b/l. Palpable pedal pulses b/l LE. Pedal hair absent b/l LE.  Lower extremity skin temperature gradient within normal limits. No pain with calf compression b/l. No edema noted b/l lower extremities.  Dermatological:  Pedal skin with normal turgor, texture and tone bilaterally. No open wounds bilaterally. No interdigital macerations bilaterally. Toenails 1-5 b/l elongated, discolored, dystrophic, thickened, crumbly with subungual debris and tenderness to dorsal palpation. No hyperkeratotic nor porokeratotic lesions present on today's visit.  Musculoskeletal:  Normal muscle strength 5/5 to all lower extremity muscle groups bilaterally. No pain crepitus or joint limitation noted with ROM b/l. No gross bony deformities bilaterally.  Neurological:  Protective sensation intact 5/5 intact bilaterally with 10g monofilament b/l. Vibratory sensation intact b/l. Clonus negative b/l.  Assessment and Plan:  1. Pain due to onychomycosis of toenails of both feet     -Examined patient. -No new findings. No new orders. -Toenails 1-5 b/l were debrided in length and girth with sterile nail nippers and dremel without iatrogenic bleeding.  -Patient to report any pedal injuries to medical professional immediately. -Patient to continue soft, supportive shoe gear daily. -Patient/POA to call should there be question/concern in the interim.  Return in about 3 months (around 02/11/2021) for painful mycotic toenails.  Marzetta Board, DPM

## 2021-02-07 ENCOUNTER — Ambulatory Visit: Payer: Medicare Other | Admitting: Podiatry

## 2021-05-18 ENCOUNTER — Other Ambulatory Visit: Payer: Self-pay

## 2021-05-18 ENCOUNTER — Ambulatory Visit: Payer: Medicare Other | Admitting: Podiatry

## 2021-05-18 DIAGNOSIS — B351 Tinea unguium: Secondary | ICD-10-CM | POA: Diagnosis not present

## 2021-05-18 DIAGNOSIS — M79674 Pain in right toe(s): Secondary | ICD-10-CM

## 2021-05-18 DIAGNOSIS — M79675 Pain in left toe(s): Secondary | ICD-10-CM | POA: Diagnosis not present

## 2021-05-23 ENCOUNTER — Encounter: Payer: Self-pay | Admitting: Podiatry

## 2021-05-23 DIAGNOSIS — I700 Atherosclerosis of aorta: Secondary | ICD-10-CM | POA: Insufficient documentation

## 2021-05-23 DIAGNOSIS — E7800 Pure hypercholesterolemia, unspecified: Secondary | ICD-10-CM | POA: Insufficient documentation

## 2021-05-23 DIAGNOSIS — Z85038 Personal history of other malignant neoplasm of large intestine: Secondary | ICD-10-CM | POA: Insufficient documentation

## 2021-05-23 DIAGNOSIS — N401 Enlarged prostate with lower urinary tract symptoms: Secondary | ICD-10-CM | POA: Insufficient documentation

## 2021-05-23 DIAGNOSIS — I2699 Other pulmonary embolism without acute cor pulmonale: Secondary | ICD-10-CM | POA: Insufficient documentation

## 2021-05-23 DIAGNOSIS — E782 Mixed hyperlipidemia: Secondary | ICD-10-CM | POA: Insufficient documentation

## 2021-05-23 DIAGNOSIS — E1169 Type 2 diabetes mellitus with other specified complication: Secondary | ICD-10-CM | POA: Insufficient documentation

## 2021-05-23 NOTE — Progress Notes (Signed)
Subjective: Pedro Palmer is a pleasant 87 y.o. male patient seen today painful thick toenails that are difficult to trim. Pain interferes with ambulation. Aggravating factors include wearing enclosed shoe gear. Pain is relieved with periodic professional debridement.  PCP is Seward Carol, MD. Last visit was: 02/01/2021.  His wife is present during today's visit.  No Known Allergies  Objective: Physical Exam  General: Pedro Palmer is a pleasant 87 y.o. African American male, WD, WN in NAD. AAO x 3.   Vascular:  Capillary fill time to digits <3 seconds b/l lower extremities. Palpable pedal pulses b/l LE. Pedal hair absent. Lower extremity skin temperature gradient within normal limits. No pain with calf compression b/l. No edema noted b/l lower extremities.  Dermatological:  Pedal skin with normal turgor, texture and tone bilaterally. No open wounds bilaterally. No interdigital macerations bilaterally. Toenails 1-5 b/l elongated, discolored, dystrophic, thickened, crumbly with subungual debris and tenderness to dorsal palpation.  Musculoskeletal:  Normal muscle strength 5/5 to all lower extremity muscle groups bilaterally. No pain crepitus or joint limitation noted with ROM b/l. No gross bony deformities bilaterally.  Neurological:  Protective sensation intact 5/5 intact bilaterally with 10g monofilament b/l. Vibratory sensation intact b/l.  Assessment and Plan:  1. Pain due to onychomycosis of toenails of both feet     -Examined patient. -Patient to continue soft, supportive shoe gear daily. -Toenails 1-5 b/l were debrided in length and girth with sterile nail nippers and dremel without iatrogenic bleeding.  -Patient to report any pedal injuries to medical professional immediately. -Patient/POA to call should there be question/concern in the interim.  Return in about 3 months (around 08/18/2021).  Marzetta Board, DPM

## 2021-08-27 ENCOUNTER — Ambulatory Visit: Payer: Medicare Other | Admitting: Podiatry

## 2021-08-27 ENCOUNTER — Other Ambulatory Visit: Payer: Self-pay

## 2021-08-27 DIAGNOSIS — E1169 Type 2 diabetes mellitus with other specified complication: Secondary | ICD-10-CM | POA: Diagnosis not present

## 2021-08-27 DIAGNOSIS — B351 Tinea unguium: Secondary | ICD-10-CM

## 2021-08-27 DIAGNOSIS — M79674 Pain in right toe(s): Secondary | ICD-10-CM

## 2021-08-27 DIAGNOSIS — M79675 Pain in left toe(s): Secondary | ICD-10-CM

## 2021-09-02 ENCOUNTER — Encounter: Payer: Self-pay | Admitting: Podiatry

## 2021-09-02 NOTE — Progress Notes (Signed)
  Subjective:  Patient ID: Pedro Palmer, male    DOB: 05-30-1933,  MRN: [id number]  Pedro Palmer presents to clinic today for preventative diabetic foot care and thick, elongated toenails b/l feet which are tender when wearing enclosed shoe gear.  His wife is present during today's visit. They voice no new pedal concerns on today's visit.  PCP is Seward Carol, MD , and last visit was three weeks ago.  No Known Allergies  Review of Systems: Negative except as noted in the HPI. Objective:   Constitutional Pedro Palmer is a pleasant 87 y.o. African American male, WD, WN in NAD. AAO x 3.   Vascular Capillary fill time to digits <3 seconds b/l lower extremities. Palpable DP pulse(s) b/l lower extremities Palpable PT pulse(s) b/l lower extremities Pedal hair absent. Lower extremity skin temperature gradient within normal limits. No pain with calf compression b/l. Trace edema noted b/l lower extremities. No cyanosis or clubbing noted.  Neurologic Normal speech. Oriented to person, place, and time. Protective sensation intact 5/5 intact bilaterally with 10g monofilament b/l. Vibratory sensation intact b/l.  Dermatologic Skin warm and supple b/l lower extremities. No open wounds b/l lower extremities. No interdigital macerations b/l lower extremities. Toenails 1-5 b/l elongated, discolored, dystrophic, thickened, crumbly with subungual debris and tenderness to dorsal palpation.  Orthopedic: Normal muscle strength 5/5 to all lower extremity muscle groups bilaterally. No pain crepitus or joint limitation noted with ROM b/l lower extremities. No gross bony deformities b/l lower extremities.   Radiographs: None Assessment:   1. Pain due to onychomycosis of toenails of both feet   2. Type 2 diabetes mellitus with other specified complication, without long-term current use of insulin ([place])    Plan:  Patient was evaluated and treated and all questions answered. Consent given for treatment as described  below: -Examined patient. -Continue diabetic foot care principles: inspect feet daily, monitor glucose as recommended by PCP and/or Endocrinologist, and follow prescribed diet per PCP, Endocrinologist and/or dietician. -Patient to continue soft, supportive shoe gear daily. -Toenails 1-5 b/l were debrided in length and girth with sterile nail nippers and dremel without iatrogenic bleeding.  -Patient to report any pedal injuries to medical professional immediately. -Patient/POA to call should there be question/concern in the interim.  Return in about 3 months (around 11/26/2021).  Marzetta Board, DPM

## 2021-11-27 ENCOUNTER — Ambulatory Visit (INDEPENDENT_AMBULATORY_CARE_PROVIDER_SITE_OTHER): Payer: Medicare Other | Admitting: Podiatry

## 2021-11-27 DIAGNOSIS — Z91199 Patient's noncompliance with other medical treatment and regimen due to unspecified reason: Secondary | ICD-10-CM

## 2021-12-04 NOTE — Progress Notes (Signed)
   Complete physical exam  Patient: Pedro Palmer   DOB: 09/28/1997   87 y.o. Male  MRN: [id number]  Subjective:    No chief complaint on file.   Pedro Palmer is a 87 y.o. male who presents today for a complete physical exam. She reports consuming a {diet types:17450} diet. {types:19826} She generally feels {DESC; WELL/FAIRLY WELL/POORLY:18703}. She reports sleeping {DESC; WELL/FAIRLY WELL/POORLY:18703}. She {does/does not:200015} have additional problems to discuss today.    Most recent fall risk assessment:    06/05/2022   10:42 AM  Fall Risk   Falls in the past year? 0  Number falls in past yr: 0  Injury with Fall? 0  Risk for fall due to : No Fall Risks  Follow up Falls evaluation completed     Most recent depression screenings:    06/05/2022   10:42 AM 04/26/2021   10:46 AM  PHQ 2/9 Scores  PHQ - 2 Score 0 0  PHQ- 9 Score 5     {VISON DENTAL STD PSA (Optional):27386}  {History (Optional):23778}  Patient Care Team: Jessup, Joy, NP as PCP - General (Nurse Practitioner)   Outpatient Medications Prior to Visit  Medication Sig   fluticasone (FLONASE) 50 MCG/ACT nasal spray Place 2 sprays into both nostrils in the morning and at bedtime. After 7 days, reduce to once daily.   norgestimate-ethinyl estradiol (SPRINTEC 28) 0.25-35 MG-MCG tablet Take 1 tablet by mouth daily.   Nystatin POWD Apply liberally to affected area 2 times per day   spironolactone (ALDACTONE) 100 MG tablet Take 1 tablet (100 mg total) by mouth daily.   No facility-administered medications prior to visit.    ROS        Objective:     There were no vitals taken for this visit. {Vitals History (Optional):23777}  Physical Exam   No results found for any visits on 07/11/22. {Show previous labs (optional):23779}    Assessment & Plan:    Routine Health Maintenance and Physical Exam  Immunization History  Administered Date(s) Administered   DTaP 12/12/1999, 02/07/2000,  04/17/2000, 01/01/2001, 07/17/2004   Hepatitis A 05/13/2008, 05/19/2009   Hepatitis B 09/29/1999, 11/06/1999, 04/17/2000   HiB (PRP-OMP) 12/12/1999, 02/07/2000, 04/17/2000, 01/01/2001   IPV 12/12/1999, 02/07/2000, 10/06/2000, 07/17/2004   Influenza,inj,Quad PF,6+ Mos 08/19/2014   Influenza-Unspecified 11/18/2012   MMR 10/06/2001, 07/17/2004   Meningococcal Polysaccharide 05/18/2012   Pneumococcal Conjugate-13 01/01/2001   Pneumococcal-Unspecified 04/17/2000, 07/01/2000   Tdap 05/18/2012   Varicella 10/06/2000, 05/13/2008    Health Maintenance  Topic Date Due   HIV Screening  Never done   Hepatitis C Screening  Never done   INFLUENZA VACCINE  07/09/2022   PAP-Cervical Cytology Screening  07/11/2022 (Originally 09/27/2020)   PAP SMEAR-Modifier  07/11/2022 (Originally 09/27/2020)   TETANUS/TDAP  07/11/2022 (Originally 05/18/2022)   HPV VACCINES  Discontinued   COVID-19 Vaccine  Discontinued    Discussed health benefits of physical activity, and encouraged her to engage in regular exercise appropriate for her age and condition.  Problem List Items Addressed This Visit   None Visit Diagnoses     Annual physical exam    -  Primary   Cervical cancer screening       Need for Tdap vaccination          No follow-ups on file.     Joy Jessup, NP   

## 2022-01-01 ENCOUNTER — Encounter: Payer: Self-pay | Admitting: Podiatry

## 2022-01-01 ENCOUNTER — Other Ambulatory Visit: Payer: Self-pay

## 2022-01-01 ENCOUNTER — Ambulatory Visit: Payer: Medicare Other | Admitting: Podiatry

## 2022-01-01 DIAGNOSIS — M79674 Pain in right toe(s): Secondary | ICD-10-CM | POA: Diagnosis not present

## 2022-01-01 DIAGNOSIS — M79675 Pain in left toe(s): Secondary | ICD-10-CM

## 2022-01-01 DIAGNOSIS — B351 Tinea unguium: Secondary | ICD-10-CM

## 2022-01-01 NOTE — Progress Notes (Signed)
°  Subjective:  Patient ID: Pedro Palmer, male    DOB: 04-05-1933,  MRN: [id number]  Pedro Palmer presents to clinic today for painful elongated mycotic toenails 1-5 bilaterally which are tender when wearing enclosed shoe gear. Pain is relieved with periodic professional debridement.  Wife states Pedro Palmer is not diabetic.  New problem(s): None.   PCP is Seward Carol, MD , and last visit was 11/20/2021.  No Known Allergies  Review of Systems: Negative except as noted in the HPI. Objective:    Constitutional Eh Sesay is a pleasant 88 y.o. African American male, WD, WN in NAD. AAO x 3.   Vascular Capillary fill time to digits <3 seconds b/l lower extremities. Palpable DP pulse(s) b/l lower extremities Palpable PT pulse(s) b/l lower extremities Pedal hair absent. Lower extremity skin temperature gradient within normal limits. No pain with calf compression b/l. Trace edema noted b/l lower extremities. No cyanosis or clubbing noted.  Neurologic Normal speech. Oriented to person, place, and time. Protective sensation intact 5/5 intact bilaterally with 10g monofilament b/l. Vibratory sensation intact b/l.  Dermatologic Skin warm and supple b/l lower extremities. No open wounds b/l lower extremities. No interdigital macerations b/l lower extremities. Toenails 1-5 b/l elongated, discolored, dystrophic, thickened, crumbly with subungual debris and tenderness to dorsal palpation.  Orthopedic: Normal muscle strength 5/5 to all lower extremity muscle groups bilaterally. No pain crepitus or joint limitation noted with ROM b/l lower extremities. No gross bony deformities b/l lower extremities.    Radiographs: None  Assessment/Plan: 1. Pain due to onychomycosis of toenails of both feet     -Wife states Pedro Palmer is not diabetic. She will clarify with Dr. Delfina Redwood.  Will defer diabetic foot exam until next visit. -Mycotic toenails 1-5 bilaterally were debrided in length and girth with sterile nail  nippers and dremel without incident. -Patient/POA to call should there be question/concern in the interim.   Return in about 3 months (around 04/01/2022).  Marzetta Board, DPM

## 2022-04-02 ENCOUNTER — Ambulatory Visit: Payer: Medicare Other | Admitting: Podiatry

## 2022-04-02 DIAGNOSIS — B351 Tinea unguium: Secondary | ICD-10-CM | POA: Diagnosis not present

## 2022-04-02 DIAGNOSIS — M79674 Pain in right toe(s): Secondary | ICD-10-CM | POA: Diagnosis not present

## 2022-04-02 DIAGNOSIS — M79675 Pain in left toe(s): Secondary | ICD-10-CM

## 2022-04-09 NOTE — Progress Notes (Signed)
?  Subjective:  ?Patient ID: Pedro Palmer, male    DOB: Jul 30, 1933,  MRN: [id number] ? ?Pedro Palmer presents to clinic today for painful elongated mycotic toenails 1-5 bilaterally which are tender when wearing enclosed shoe gear. Pain is relieved with periodic professional debridement. ? ?New problem(s): None.  ? ?PCP is Seward Carol, MD , and last visit was March 16, 2022. ? ?No Known Allergies ? ?Review of Systems: Negative except as noted in the HPI. ? ?Objective: No changes noted in today's physical examination. ? ?Constitutional Pedro Palmer is a pleasant 89y.o. African American male, WD, WN in NAD. AAO x 3.   ?Vascular Capillary fill time to digits <3 seconds b/l lower extremities. Palpable DP pulse(s) b/l lower extremities Palpable PT pulse(s) b/l lower extremities Pedal hair absent. Lower extremity skin temperature gradient within normal limits. No pain with calf compression b/l. Trace edema noted b/l lower extremities. No cyanosis or clubbing noted.  ?Neurologic Normal speech. Oriented to person, place, and time. Protective sensation intact 5/5 intact bilaterally with 10g monofilament b/l. Vibratory sensation intact b/l.  ?Dermatologic Skin warm and supple b/l lower extremities. No open wounds b/l lower extremities. No interdigital macerations b/l lower extremities. Toenails 1-5 b/l elongated, discolored, dystrophic, thickened, crumbly with subungual debris and tenderness to dorsal palpation.  ?Orthopedic: Normal muscle strength 5/5 to all lower extremity muscle groups bilaterally. No pain crepitus or joint limitation noted with ROM b/l lower extremities. No gross bony deformities b/l lower extremities.  ?  ?Radiographs: None ? ?Assessment/Plan: ?No diagnosis found.  ? ?-Examined patient. ?-No new findings. No new orders. ?-Patient to continue soft, supportive shoe gear daily. ?-Toenails 1-5 b/l were debrided in length and girth with sterile nail nippers and dremel without iatrogenic bleeding.   ?-Patient/POA to call should there be question/concern in the interim.  ? ?Return in about 3 months (around 07/02/2022). ? ?Marzetta Board, DPM  ?

## 2022-04-10 ENCOUNTER — Encounter: Payer: Self-pay | Admitting: Podiatry

## 2022-07-16 ENCOUNTER — Ambulatory Visit: Payer: Medicare Other | Admitting: Podiatry

## 2022-07-16 ENCOUNTER — Encounter: Payer: Self-pay | Admitting: Podiatry

## 2022-07-16 DIAGNOSIS — M79675 Pain in left toe(s): Secondary | ICD-10-CM

## 2022-07-16 DIAGNOSIS — M79674 Pain in right toe(s): Secondary | ICD-10-CM | POA: Diagnosis not present

## 2022-07-16 DIAGNOSIS — B351 Tinea unguium: Secondary | ICD-10-CM | POA: Diagnosis not present

## 2022-07-22 NOTE — Progress Notes (Signed)
Subjective: Pedro Palmer is a pleasant 88 y.o. male patient seen today for painful elongated mycotic toenails 1-5 bilaterally which are tender when wearing enclosed shoe gear. Pain is relieved with periodic professional debridement.   Patient is accompanied by his wife on today's visit. They voice no new pedal problems on today's visit.  PCP is Seward Carol, MD. Last visit was: July 12, 2022.  No Known Allergies  Objective: Constitutional Pedro Palmer is a pleasant 89y.o. African American male, WD, WN in NAD. AAO x 3.   Vascular Capillary fill time to digits <3 seconds b/l lower extremities. Palpable DP pulse(s) b/l lower extremities Palpable PT pulse(s) b/l lower extremities Pedal hair absent. Lower extremity skin temperature gradient within normal limits. No pain with calf compression b/l. Trace edema noted b/l lower extremities. No cyanosis or clubbing noted.  Neurologic Normal speech. Oriented to person, place, and time. Protective sensation intact 5/5 intact bilaterally with 10g monofilament b/l. Vibratory sensation intact b/l.  Dermatologic Skin warm and supple b/l lower extremities. No open wounds b/l lower extremities. No interdigital macerations b/l lower extremities. Toenails 1-5 b/l elongated, discolored, dystrophic, thickened, crumbly with subungual debris and tenderness to dorsal palpation.  Orthopedic: Normal muscle strength 5/5 to all lower extremity muscle groups bilaterally. No pain crepitus or joint limitation noted with ROM b/l lower extremities. No gross bony deformities b/l lower extremities.    Radiographs: None  Assessment and Plan:  1. Pain due to onychomycosis of toenails of both feet     Patient was evaluated and treated and all questions answered. Consent given for treatment as described below: -Patient was evaluated and treated. All patient's and/or POA's questions/concerns answered on today's visit. -Mycotic toenails 1-5 bilaterally were debrided in length and  girth with sterile nail nippers and dremel without incident. -Patient/POA to call should there be question/concern in the interim.  Return in about 3 months (around 10/16/2022).  Marzetta Board, DPM

## 2022-10-21 ENCOUNTER — Ambulatory Visit (INDEPENDENT_AMBULATORY_CARE_PROVIDER_SITE_OTHER): Payer: Medicare Other | Admitting: Podiatry

## 2022-10-21 DIAGNOSIS — Z91199 Patient's noncompliance with other medical treatment and regimen due to unspecified reason: Secondary | ICD-10-CM

## 2022-10-22 NOTE — Progress Notes (Signed)
1. No-show for appointment     

## 2022-12-07 ENCOUNTER — Encounter (HOSPITAL_COMMUNITY): Payer: Self-pay

## 2022-12-07 ENCOUNTER — Ambulatory Visit (HOSPITAL_COMMUNITY)
Admission: EM | Admit: 2022-12-07 | Discharge: 2022-12-07 | Disposition: A | Discharge status: Home / Self Care | Payer: Medicare Other | Attending: Urgent Care | Admitting: Urgent Care

## 2022-12-07 DIAGNOSIS — Z1152 Encounter for screening for COVID-19: Secondary | ICD-10-CM | POA: Diagnosis not present

## 2022-12-07 DIAGNOSIS — J069 Acute upper respiratory infection, unspecified: Secondary | ICD-10-CM | POA: Insufficient documentation

## 2022-12-07 MED ORDER — CETIRIZINE HCL 5 MG PO TABS: 5.0000 mg | ORAL_TABLET | Freq: Every day | ORAL | 0 refills | Status: DC

## 2022-12-07 MED ORDER — AZELASTINE HCL 0.1 % NA SOLN: 1.0000 | Freq: Two times a day (BID) | NASAL | 0 refills | Status: DC

## 2022-12-07 NOTE — Discharge Instructions (Addendum)
You were tested today for COVID. We will call with results of the test if positive. To help combat your symptoms, please start taking azelastine nasal spray as prescribed. You may also chew 1 tablet of cetirizine before bed. Please use over-the-counter Oscillococcinum to help with your body aches. Rest and drink plenty of fluid.

## 2022-12-07 NOTE — ED Provider Notes (Signed)
[place]    CSN: [id number] Arrival date & time: 12/07/22  1327      History   Chief Complaint Chief Complaint  Patient presents with   Cough    HPI Pedro Palmer is a 88 y.o. male.   88 year old male presents today with his wife due to concerns of cough, body aches, headache.  Wife gives the history.  Patient's symptoms have been present for 2 days, following his wife's.  Wife is concerned that he picked something up from the grandkids on Christmas.  Additionally, wife states that he has been sneezing with clear rhinorrhea.  Cough has been dry.  Denies known fever.  They have not been taking any over-the-counter medications.  Reports feeling tired.  Did not take any home COVID test.   Cough   Past Medical History:  Diagnosis Date   Arthritis    Colon cancer ([place]) dx'd 04/2010   Hypercholesteremia    Hypertension    Paget disease of bone    Pulmonary emboli ([place]) 2011 or 2012    Patient Active Problem List   Diagnosis Date Noted   Benign prostatic hyperplasia with lower urinary tract symptoms 05/23/2021   Hardening of the aorta (main artery of the heart) ([place]) 05/23/2021   History of malignant neoplasm of colon 05/23/2021   Hypercalcemia 05/23/2021   Mixed hyperlipidemia 05/23/2021   Pulmonary embolism ([place]) 05/23/2021   Pure hypercholesterolemia 05/23/2021   Type 2 diabetes mellitus with other specified complication ([place]) 33/29/5188   Prostate cancer ([place]) 08/28/2019   Constipation 08/28/2019   Sepsis ([place]) 12/13/2018   Paget disease of bone    Colon cancer ([place]) 07/21/2012   HTN (hypertension) 07/21/2012    Past Surgical History:  Procedure Laterality Date   APPENDECTOMY     COLONOSCOPY WITH PROPOFOL N/A 07/25/2014   Procedure: COLONOSCOPY WITH PROPOFOL;  Surgeon: Garlan Fair, MD;  Location: [facility];  Service: Endoscopy;  Laterality: N/A;   HEMORRHOID SURGERY  1994   KELOID EXCISION     LEFT COLECTOMY  07/03/2010   TRANSURETHRAL  RESECTION OF PROSTATE N/A 11/17/2019   Procedure: TRANSURETHRAL RESECTION OF THE PROSTATE (TURP), BIPOLAR;  Surgeon: Ceasar Mons, MD;  Location: [facility];  Service: Urology;  Laterality: N/A;       Home Medications    Prior to Admission medications   Medication Sig Start Date End Date Taking? Authorizing Provider  azelastine (ASTELIN) 0.1 % nasal spray Place 1 spray into both nostrils 2 (two) times daily. Use in each nostril as directed 12/07/22  Yes Blanch Stang L, PA  cetirizine (ZYRTEC) 5 MG tablet Take 1 tablet (5 mg total) by mouth daily. 12/07/22  Yes Mahki Spikes L, PA  amLODipine (NORVASC) 10 MG tablet Take 1 tablet (10 mg total) by mouth daily. 08/30/19   [place] Eric J, DO  amLODipine (NORVASC) 10 MG tablet Take 1 tablet by mouth daily.    [provider]  Ascorbic Acid (VITAMIN C PO) Take 1,000 mg by mouth daily.     [provider]  ASPIRIN 81 PO 1 tablet    [provider]  aspirin EC 81 MG tablet Take 81 mg by mouth daily.    [provider]  docusate sodium (STOOL SOFTENER) 100 MG capsule 1 capsule as needed    [provider]  lisinopril (ZESTRIL) 20 MG tablet 1 tablet    [provider]  meloxicam (MOBIC) 15 MG tablet 1 tablet    [provider]  Multiple Vitamin (MULTIVITAMIN ADULT PO) 1 tablet    [provider]  Multiple Vitamin (MULTIVITAMIN WITH MINERALS) TABS tablet Take 1 tablet by mouth daily.    [provider]  oxybutynin (DITROPAN) 5 MG tablet Take 5 mg by mouth 3 (three) times daily. 12/23/19   [provider]  simvastatin (ZOCOR) 20 MG tablet Take 20 mg by mouth every evening.     [provider]  simvastatin (ZOCOR) 20 MG tablet Take 1 tablet by mouth every evening.    [provider]  tamsulosin (FLOMAX) 0.4 MG CAPS capsule 1 capsule    [provider]  traMADol (ULTRAM) 50 MG tablet Take 50 mg by mouth daily  as needed. 04/30/20   [provider]  traMADol Veatrice Bourbon) 50 MG tablet 1 tablet 10/25/20   [provider]    Family History History reviewed. No pertinent family history.  Social History Social History   Tobacco Use   Smoking status: Never   Smokeless tobacco: Never  Vaping Use   Vaping Use: Never used  Substance Use Topics   Alcohol use: No   Drug use: No     Allergies   Patient has no known allergies.   Review of Systems Review of Systems  Respiratory:  Positive for cough.   As per HPI   Physical Exam Triage Vital Signs ED Triage Vitals  Enc Vitals Group     BP 12/07/22 1646 (!) 170/80     Pulse Rate 12/07/22 1646 73     Resp 12/07/22 1646 12     Temp 12/07/22 1646 98.2 F (36.8 C)     Temp Source 12/07/22 1646 Oral     SpO2 12/07/22 1646 95 %     Weight --      Height --      Head Circumference --      Peak Flow --      Pain Score 12/07/22 1645 0     Pain Loc --      Pain Edu? --      Excl. in [place]? --    No data found.  Updated Vital Signs BP (!) 170/80 (BP Location: Left Arm)   Pulse 73   Temp 98.2 F (36.8 C) (Oral)   Resp 12   SpO2 95%   Visual Acuity Right Eye Distance:   Left Eye Distance:   Bilateral Distance:    Right Eye Near:   Left Eye Near:    Bilateral Near:     Physical Exam Vitals and nursing note reviewed. Exam conducted with a chaperone present.  Constitutional:      General: He is not in acute distress.    Appearance: Normal appearance. He is normal weight. He is not ill-appearing, toxic-appearing or diaphoretic.  HENT:     Head: Normocephalic and atraumatic.     Right Ear: Tympanic membrane and ear canal normal. No drainage, swelling or tenderness. No middle ear effusion. Tympanic membrane is not erythematous.     Left Ear: Tympanic membrane and ear canal normal. No drainage, swelling or tenderness.  No middle ear effusion. Tympanic membrane is not erythematous.     Nose: Congestion and rhinorrhea  present.     Mouth/Throat:     Mouth: Mucous membranes are moist. No oral lesions.     Pharynx: Oropharynx is clear. No pharyngeal swelling, oropharyngeal exudate, posterior oropharyngeal erythema or uvula swelling.     Tonsils: No tonsillar exudate or tonsillar abscesses.  Eyes:     General: No scleral icterus.       Right eye: No discharge.        Left eye: No discharge.     Extraocular Movements: Extraocular movements intact.     Left eye: Normal extraocular motion.     Conjunctiva/sclera: Conjunctivae normal.     Pupils: Pupils are equal, round, and reactive to light.  Neck:     Thyroid: No thyromegaly.  Cardiovascular:     Rate and Rhythm: Normal rate and regular rhythm.     Heart sounds: Normal heart sounds. No murmur heard.    No friction rub. No gallop.  Pulmonary:     Effort: Pulmonary effort is normal. No respiratory distress.     Breath sounds: Normal breath sounds. No stridor. No wheezing, rhonchi or rales.  Chest:     Chest wall: No tenderness.  Abdominal:     Palpations: Abdomen is soft.  Musculoskeletal:     Cervical back: Normal range of motion and neck supple. No rigidity or tenderness.  Lymphadenopathy:     Cervical: No cervical adenopathy.  Skin:    General: Skin is warm.     Capillary Refill: Capillary refill takes less than 2 seconds.     Coloration: Skin is not pale.     Findings: No erythema or rash.  Neurological:     General: No focal deficit present.     Mental Status: He is alert.  Psychiatric:        Mood and Affect: Mood normal.        Behavior: Behavior normal.      UC Treatments / Results  Labs (all labs ordered are listed, but only abnormal results are displayed) Labs Reviewed  SARS CORONAVIRUS 2 (TAT 6-24 HRS)    EKG   Radiology No results found.  Procedures Procedures (including critical care time)  Medications Ordered in UC Medications - No data to display  Initial Impression / Assessment and Plan / UC Course  I have  reviewed the triage vital signs and the nursing notes.  Pertinent labs & imaging results that were available during my care of the patient were reviewed by me and considered in my medical decision making (see chart for details).     Viral URI with cough -patient does have visible clear rhinorrhea and nasal congestion, but otherwise benign exam.  Will treat symptoms with half a tab of cetirizine and azelastine nasal spray.  He may also take over-the-counter plain Mucinex, but must avoid anything that may raise blood pressure.  Drink plenty of fluids.  COVID test obtained, results pending.  Patient is on day 2 of symptoms currently, thus will be a candidate for antiviral therapy if COVID test is positive.  Patient instructed to return to the clinic if he develops shortness of breath, uncontrollable fever, chest discomfort, or any new or concerning symptoms.   Final Clinical Impressions(s) / UC Diagnoses   Final diagnoses:  Viral URI with cough     Discharge Instructions      You were tested today for COVID. We will call with results of the test if positive. To help combat your symptoms, please start taking azelastine nasal spray as prescribed. You may also chew 1 tablet of cetirizine before bed. Please use over-the-counter Oscillococcinum to help with your body aches. Rest and drink plenty of fluid.    ED Prescriptions     Medication Sig Dispense Auth. Provider   azelastine (ASTELIN) 0.1 % nasal spray  Place 1 spray into both nostrils 2 (two) times daily. Use in each nostril as directed 30 mL Tonica Brasington L, PA   cetirizine (ZYRTEC) 5 MG tablet Take 1 tablet (5 mg total) by mouth daily. 14 tablet Sereniti Wan L, [place]      PDMP not reviewed this encounter.   Chaney Malling, [place] 12/07/22 [phone]

## 2022-12-07 NOTE — ED Triage Notes (Signed)
Pt is here for cough, sneezing, runny nose , low energy,  body aches x 2days

## 2022-12-08 LAB — SARS CORONAVIRUS 2 (TAT 6-24 HRS): SARS Coronavirus 2: NEGATIVE

## 2023-03-18 ENCOUNTER — Encounter: Payer: Self-pay | Admitting: Podiatry

## 2023-03-18 ENCOUNTER — Ambulatory Visit: Payer: Medicare Other | Admitting: Podiatry

## 2023-03-18 DIAGNOSIS — B351 Tinea unguium: Secondary | ICD-10-CM

## 2023-03-18 DIAGNOSIS — M79675 Pain in left toe(s): Secondary | ICD-10-CM

## 2023-03-18 DIAGNOSIS — M79674 Pain in right toe(s): Secondary | ICD-10-CM | POA: Diagnosis not present

## 2023-03-18 NOTE — Progress Notes (Signed)
  Subjective:  Patient ID: Pedro Palmer, male    DOB: May 10, 1933,  MRN: [id number]  Pedro Palmer presents to clinic today for painful thick toenails that are difficult to trim. Pain interferes with ambulation. Aggravating factors include wearing enclosed shoe gear. Pain is relieved with periodic professional debridement.  Chief Complaint  Patient presents with   Nail Problem    RFC PCP-Polite PCP VST-last week   New problem(s): None.   PCP is Renford Dills, MD.  No Known Allergies  Review of Systems: Negative except as noted in the HPI.  Objective: No changes noted in today's physical examination. There were no vitals filed for this visit.  Pedro Palmer is a pleasant 89 y.o. male WD, WN in NAD. AAO x 3.  Constitutional Pedro Palmer is a pleasant 89y.o. African American male, WD, WN in NAD. AAO x 3.   Vascular Capillary fill time to digits <3 seconds b/l lower extremities. Palpable DP pulse(s) b/l lower extremities Palpable PT pulse(s) b/l lower extremities Pedal hair absent. Lower extremity skin temperature gradient within normal limits. No pain with calf compression b/l. Trace edema noted b/l lower extremities. No cyanosis or clubbing noted.  Neurologic Normal speech. Oriented to person, place, and time. Protective sensation intact 5/5 intact bilaterally with 10g monofilament b/l. Vibratory sensation intact b/l.  Dermatologic Skin warm and supple b/l lower extremities. No open wounds b/l lower extremities. No interdigital macerations b/l lower extremities. Toenails 1-5 b/l elongated, discolored, dystrophic, thickened, crumbly with subungual debris and tenderness to dorsal palpation.  Orthopedic: Normal muscle strength 5/5 to all lower extremity muscle groups bilaterally. No pain crepitus or joint limitation noted with ROM b/l lower extremities. No gross bony deformities b/l lower extremities.    Radiographs: None  Assessment/Plan: 1. Pain due to onychomycosis of toenails of both  feet    -Patient's family member present. All questions/concerns addressed on today's visit. -Examined patient. -Patient to continue soft, supportive shoe gear daily. -Toenails 1-5 b/l were debrided in length and girth with sterile nail nippers and dremel without iatrogenic bleeding.  -Patient/POA to call should there be question/concern in the interim.   Return in about 3 months (around 06/17/2023).  [name], DPM

## 2023-07-15 ENCOUNTER — Encounter: Payer: Self-pay | Admitting: Podiatry

## 2023-07-15 ENCOUNTER — Ambulatory Visit: Payer: Medicare Other | Admitting: Podiatry

## 2023-07-15 DIAGNOSIS — B353 Tinea pedis: Secondary | ICD-10-CM | POA: Diagnosis not present

## 2023-07-15 DIAGNOSIS — B351 Tinea unguium: Secondary | ICD-10-CM

## 2023-07-15 DIAGNOSIS — M79674 Pain in right toe(s): Secondary | ICD-10-CM | POA: Diagnosis not present

## 2023-07-15 DIAGNOSIS — M79675 Pain in left toe(s): Secondary | ICD-10-CM

## 2023-07-15 DIAGNOSIS — E1169 Type 2 diabetes mellitus with other specified complication: Secondary | ICD-10-CM | POA: Diagnosis not present

## 2023-07-15 MED ORDER — MICONAZOLE NITRATE 2 % EX AERO: INHALATION_SPRAY | CUTANEOUS | 0 refills | Status: AC

## 2023-07-15 NOTE — Patient Instructions (Signed)
 To prevent reinfection, spray shoes with lysol every evening.  Clean tub or shower with bleach based cleanser.  Athlete's Foot Athlete's foot (tinea pedis) is a fungal infection of the skin on your feet. It often occurs on the skin that is between or underneath the toes. It can also occur on the soles of your feet. The infection can spread from person to person (is contagious). It can also spread when a person's bare feet come in contact with the fungus on shower floors or on items such as shoes. What are the causes? This condition is caused by a fungus that grows in warm, moist places. You can get athlete's foot by sharing shoes, shower stalls, towels, and wet floors with someone who is infected. Not washing your feet or changing your socks often enough can also lead to athlete's foot. What increases the risk? This condition is more likely to develop in: Men. People who have a weak body defense system (immune system). People who have diabetes. People who use public showers, such as at a gym. People who wear heavy-duty shoes, such as industrial or military shoes. Seasons with warm, humid weather. What are the signs or symptoms? Symptoms of this condition include: Itchy areas between your toes or on the soles of your feet. White, flaky, or scaly areas between your toes or on the soles of your feet. Very itchy small blisters between your toes or on the soles of your feet. Small cuts in your skin. These cuts can become infected. Thick or discolored toenails. How is this diagnosed? This condition may be diagnosed with a physical exam and a review of your medical history. Your health care provider may also take a skin or toenail sample to examine under a microscope. How is this treated? This condition is treated with antifungal medicines. These may be applied as powders, ointments, or creams. In severe cases, an oral antifungal medicine may be given. Follow these instructions at  home: Medicines Apply or take over-the-counter and prescription medicines only as told by your health care provider. Apply your antifungal medicine as told by your health care provider. Do not stop using the antifungal even if your condition improves. Foot care Do not scratch your feet. Keep your feet dry: Wear cotton or wool socks. Change your socks every day or if they become wet. Wear shoes that allow air to flow, such as sandals or canvas tennis shoes. Wash and dry your feet, including the area between your toes. Also, wash and dry your feet: Every day or as told by your health care provider. After exercising. General instructions Do not let others use towels, shoes, nail clippers, or other personal items that touch your feet. Protect your feet by wearing sandals in wet areas, such as locker rooms and shared showers. Keep all follow-up visits. This is important. If you have diabetes, keep your blood sugar under control. Contact a health care provider if: You have a fever. You have swelling, soreness, warmth, or redness in your foot. Your feet are not getting better with treatment. Your symptoms get worse. You have new symptoms. You have severe pain. Summary Athlete's foot (tinea pedis) is a fungal infection of the skin on your feet. It often occurs on skin that is between or underneath the toes. This condition is caused by a fungus that grows in warm, moist places. Symptoms include white, flaky, or scaly areas between your toes or on the soles of your feet. This condition is treated with antifungal medicines.   Keep your feet clean. Always dry them thoroughly. This information is not intended to replace advice given to you by your health care provider. Make sure you discuss any questions you have with your health care provider. Document Revised: 03/18/2021 Document Reviewed: 03/18/2021 Elsevier Patient Education  2024 Elsevier Inc.  

## 2023-07-25 NOTE — Progress Notes (Signed)
  Subjective:  Patient ID: Pedro Palmer, male    DOB: [birth year],  MRN: [id number]  Pedro Palmer presents to clinic today for preventative diabetic foot care and painful elongated mycotic toenails 1-5 bilaterally which are tender when wearing enclosed shoe gear. Pain is relieved with periodic professional debridement. Chief Complaint  Patient presents with   Nail Problem    rfc   New problem(s): None.   PCP is Renford Dills, MD. Theron Arista 03/04/2023.  No Known Allergies  Review of Systems: Negative except as noted in the HPI. Objective:  There were no vitals filed for this visit. Constitutional Pedro Palmer is a pleasant 89 y.o. male, WD, WN in NAD. AAO x 3.   Vascular Vascular Examination: Capillary refill time immediate b/l. Vascular status intact b/l with palpable pedal pulses. Pedal hair absent b/l. No edema. No pain with calf compression b/l. Skin temperature gradient WNL b/l. No cyanosis or clubbing b/l.   Neurological Examination: Sensation grossly intact b/l with 10 gram monofilament. Vibratory sensation intact b/l.   Dermatological Examination: Pedal skin with normal turgor, texture and tone b/l.  No open wounds. No interdigital macerations.   Toenails 1-5 b/l thick, discolored, elongated with subungual debris and pain on dorsal palpation.   Diffuse scaling noted interdigitally b/l feet. No blisters, no weeping. No signs of secondary bacterial infection noted.  Musculoskeletal Examination: Muscle strength 5/5 to all lower extremity muscle groups bilaterally. No pain, crepitus or joint limitation noted with ROM bilateral LE. No gross bony deformities bilaterally.  Radiographs: None    Assessment:   1. Pain due to onychomycosis of toenails of both feet   2. Tinea pedis of both feet   3. Type 2 diabetes mellitus with other specified complication, without long-term current use of insulin (HCC)    Plan:  Patient was evaluated and treated and all questions answered. Consent  given for treatment as described below: -Patient's family member present. All questions/concerns addressed on today's visit. -Patient to continue soft, supportive shoe gear daily. -Mycotic toenails 1-5 bilaterally were debrided in length and girth with sterile nail nippers and dremel without incident. -Discussed tinea pedis infection. To prevent re-infection of tinea pedis, patient/POA/caregiver instructed to spray shoes with Lysol every evening and clean tub/shower with bleach based cleanser. -Rx written for Miconazole Spray Powder 2% antifungal spray powder. Spray between toes once daily. Dry well between toes and under toes after bath/shower. -Patient/POA to call should there be question/concern in the interim.  Return in about 3 months (around 10/15/2023).  Pedro Palmer, DPM

## 2023-11-18 ENCOUNTER — Encounter: Payer: Self-pay | Admitting: Podiatry

## 2023-11-18 ENCOUNTER — Ambulatory Visit (INDEPENDENT_AMBULATORY_CARE_PROVIDER_SITE_OTHER): Payer: Medicare Other | Admitting: Podiatry

## 2023-11-18 DIAGNOSIS — E1169 Type 2 diabetes mellitus with other specified complication: Secondary | ICD-10-CM | POA: Diagnosis not present

## 2023-11-18 DIAGNOSIS — B351 Tinea unguium: Secondary | ICD-10-CM | POA: Diagnosis not present

## 2023-11-18 DIAGNOSIS — M79675 Pain in left toe(s): Secondary | ICD-10-CM

## 2023-11-18 DIAGNOSIS — M79674 Pain in right toe(s): Secondary | ICD-10-CM

## 2023-11-24 NOTE — Progress Notes (Signed)
  Subjective:  Patient ID: Pedro Palmer, male    DOB: Jan 01, 1933,  MRN: [id number]  89 y.o. male presents preventative diabetic foot care and painful thick toenails that are difficult to trim. Pain interferes with ambulation. Aggravating factors include wearing enclosed shoe gear. Pain is relieved with periodic professional debridement. He is accompanied by his wife on today's visit. Wife states they have been using spray between toes as directed. Chief Complaint  Patient presents with   Routine Foot Care    Patient states his feet have been pretty good since last visit , patient last seen pcp about a month ago    New problem(s): None   PCP is Renford Dills, MD.  No Known Allergies  Review of Systems: Negative except as noted in the HPI.   Objective:  Pedro Palmer is a pleasant 89 y.o. male WD, WN in NAD. AAO x 3.  Vascular Examination: Vascular status intact b/l with palpable pedal pulses. CFT immediate b/l. Pedal hair present. No edema. No pain with calf compression b/l. Skin temperature gradient WNL b/l. No varicosities noted. No cyanosis or clubbing noted.  Neurological Examination: Sensation grossly intact b/l with 10 gram monofilament. Vibratory sensation intact b/l.  Dermatological Examination: Pedal skin with normal turgor, texture and tone b/l. No open wounds nor interdigital macerations noted today. Toenails 1-5 b/l thick, discolored, elongated with subungual debris and pain on dorsal palpation. No hyperkeratotic lesions noted b/l.   Musculoskeletal Examination: Muscle strength 5/5 to b/l LE.  No pain, crepitus noted b/l. No gross pedal deformities. Patient ambulates independently without assistive aids.   Radiographs: None  Assessment:   1. Pain due to onychomycosis of toenails of both feet   2. Type 2 diabetes mellitus with other specified complication, without long-term current use of insulin (HCC)    Plan:  -Examined patient. -Interdigital scaling has resolved  b/l. -Continue supportive shoe gear daily. -Toenails 1-5 b/l were debrided in length and girth with sterile nail nippers and dremel without iatrogenic bleeding.  -Patient/POA to call should there be question/concern in the interim.  Return in about 3 months (around 02/16/2024).  [name], DPM      [place] LOCATION: [address] [place] [address]                   Office [phone]   [facility] LOCATION: [address] [place], [place] [address] Office [phone]

## 2024-03-23 ENCOUNTER — Encounter: Payer: Self-pay | Admitting: Podiatry

## 2024-03-23 ENCOUNTER — Ambulatory Visit: Payer: Medicare Other | Admitting: Podiatry

## 2024-03-23 VITALS — Ht 67.0 in | Wt 170.0 lb

## 2024-03-23 DIAGNOSIS — M79674 Pain in right toe(s): Secondary | ICD-10-CM | POA: Diagnosis not present

## 2024-03-23 DIAGNOSIS — B351 Tinea unguium: Secondary | ICD-10-CM

## 2024-03-23 DIAGNOSIS — M79675 Pain in left toe(s): Secondary | ICD-10-CM

## 2024-03-23 DIAGNOSIS — E1169 Type 2 diabetes mellitus with other specified complication: Secondary | ICD-10-CM

## 2024-03-30 ENCOUNTER — Encounter: Payer: Self-pay | Admitting: Podiatry

## 2024-03-30 NOTE — Progress Notes (Signed)
  Subjective:  Patient ID: Pedro Palmer, male    DOB: 04-Sep-1933,  MRN: [id number]  89 y.o. male presents to clinic with  preventative diabetic foot care and painful, elongated thickened toenails x 10 which are symptomatic when wearing enclosed shoe gear. This interferes with his/her daily activities. His wife is present during today's visit. Chief Complaint  Patient presents with   Nail Problem    Pt is here for [facility] PCP is Dr Joice Nares and LOV was in February.   New problem(s): None   PCP is Merl Star, MD.  No Known Allergies  Review of Systems: Negative except as noted in the HPI.   Objective:  Pedro Palmer is a pleasant 89 y.o. male in NAD. AAO x 3.  Vascular Examination: Vascular status intact b/l with palpable pedal pulses. CFT immediate b/l. No edema. No pain with calf compression b/l. Skin temperature gradient WNL b/l. Pedal hair sparse. No varicosities noted.  Neurological Examination: Sensation grossly intact b/l with 10 gram monofilament. Vibratory sensation intact b/l.   Dermatological Examination: Pedal skin with normal turgor, texture and tone b/l. Toenails 1-5 b/l thick, discolored, elongated with subungual debris and pain on dorsal palpation. No hyperkeratotic lesions noted b/l.   Musculoskeletal Examination: Muscle strength 5/5 to b/l LE. No pain, crepitus or joint limitation noted with ROM bilateral LE. No gross bony deformities bilaterally.  Radiographs: None  Last A1c:       No data to display           Assessment:   1. Pain due to onychomycosis of toenails of both feet   2. Type 2 diabetes mellitus with other specified complication, without long-term current use of insulin (HCC)    Plan:  Patient was evaluated and treated. All patient's and/or POA's questions/concerns addressed on today's visit. Mycotic toenails 1-5 debrided in length and girth without incident. Continue soft, supportive shoe gear daily. Report any pedal injuries to medical  professional. Call office if there are any quesitons/concerns. -Patient/POA to call should there be question/concern in the interim.  Return in about 3 months (around 06/22/2024).  Pedro Palmer, DPM      [place] LOCATION: [address], [place] [address]                   Office [phone]   [facility] LOCATION: [address] [place], [place] [address] Office [phone]

## 2024-07-06 ENCOUNTER — Ambulatory Visit: Admitting: Podiatry

## 2024-07-06 ENCOUNTER — Encounter: Payer: Self-pay | Admitting: Podiatry

## 2024-07-06 DIAGNOSIS — M79674 Pain in right toe(s): Secondary | ICD-10-CM | POA: Diagnosis not present

## 2024-07-06 DIAGNOSIS — E1169 Type 2 diabetes mellitus with other specified complication: Secondary | ICD-10-CM | POA: Diagnosis not present

## 2024-07-06 DIAGNOSIS — M79675 Pain in left toe(s): Secondary | ICD-10-CM

## 2024-07-06 DIAGNOSIS — B351 Tinea unguium: Secondary | ICD-10-CM

## 2024-07-09 NOTE — Progress Notes (Signed)
  Subjective:  Patient ID: Pedro Palmer, male    DOB: 1933-04-02,  MRN: [id number]  Pedro Palmer presents to clinic today for preventative diabetic foot care and painful thick toenails that are difficult to trim. Pain interferes with ambulation. Aggravating factors include wearing enclosed shoe gear. Pain is relieved with periodic professional debridement. He is accompanied by his wife on today's visit. They voice no new pedal concerns on today's visit. Chief Complaint  Patient presents with   RFc    Rm15 Routine foot care/Dr. Rexanne April 2025   New problem(s): None.   PCP is Rexanne Ingle, MD.  No Known Allergies  Review of Systems: Negative except as noted in the HPI.  Objective: No changes noted in today's physical examination. There were no vitals filed for this visit. Pedro Palmer is a pleasant 89 y.o. male WD, WN in NAD. AAO x 3.  Vascular Examination: Capillary refill time immediate b/l. Palpable pedal pulses. Pedal hair absent b/l. Trace edema b/l. No pain with calf compression b/l. Skin temperature gradient WNL b/l. No cyanosis or clubbing b/l. No ischemia or gangrene noted b/l.   Neurological Examination: Sensation grossly intact b/l with 10 gram monofilament. Vibratory sensation intact b/l.   Dermatological Examination: Pedal skin with normal turgor, texture and tone b/l.  No open wounds. No interdigital macerations.   Toenails 1-5 b/l thick, discolored, elongated with subungual debris and pain on dorsal palpation.   No corns, calluses, nor porokeratotic lesions.  Musculoskeletal Examination: Muscle strength 5/5 to all lower extremity muscle groups bilaterally. No pain, crepitus or joint limitation noted with ROM b/l LE. No gross bony pedal deformities b/l. Patient ambulates independently without assistive aids.  Radiographs: None  Assessment/Plan: 1. Pain due to onychomycosis of toenails of both feet   2. Type 2 diabetes mellitus with other specified  complication, without long-term current use of insulin [facility])     Consent given for treatment. Patient examined. All patient's and/or POA's questions/concerns addressed on today's visit. Toenails 1-5 debrided in length and girth without incident. Continue foot and shoe inspections daily. Monitor blood glucose per PCP/Endocrinologist's recommendations. Continue soft, supportive shoe gear daily. Report any pedal injuries to medical professional. Call office if there are any questions/concerns. -Patient/POA to call should there be question/concern in the interim.   Return in about 3 months (around 10/06/2024).  Delon LITTIE Merlin, DPM      [place] LOCATION: [address], [place] [address]                   Office [phone]   [facility] LOCATION: [address] [place], [place] [address] Office [phone]

## 2024-08-05 ENCOUNTER — Encounter: Payer: Self-pay | Admitting: Neurology

## 2024-08-05 ENCOUNTER — Ambulatory Visit: Admitting: Neurology

## 2024-08-05 VITALS — BP 138/84 | HR 89

## 2024-08-05 DIAGNOSIS — F02A0 Dementia in other diseases classified elsewhere, mild, without behavioral disturbance, psychotic disturbance, mood disturbance, and anxiety: Secondary | ICD-10-CM | POA: Diagnosis not present

## 2024-08-05 DIAGNOSIS — G301 Alzheimer's disease with late onset: Secondary | ICD-10-CM | POA: Diagnosis not present

## 2024-08-05 MED ORDER — DONEPEZIL HCL 5 MG PO TABS: 5.0000 mg | ORAL_TABLET | Freq: Every day | ORAL | 3 refills | Status: DC

## 2024-08-05 NOTE — Patient Instructions (Addendum)
 Will obtain the dementia lab including B12, TSH and ATN profile Head CT without contrast Continue your other medications Start Aricept  5 mg nightly, side effect include vivid dream, diarrhea, dizziness, if able to tolerate will increase to 10 mg nightly Continue follow-up with PCP Return if worse  There are well-accepted and sensible ways to reduce risk for Alzheimers disease and other degenerative brain disorders .  Exercise Daily Walk A daily 20 minute walk should be part of your routine. Disease related apathy can be a significant roadblock to exercise and the only way to overcome this is to make it a daily routine and perhaps have a reward at the end (something your loved one loves to eat or drink perhaps) or a personal trainer coming to the home can also be very useful. Most importantly, the patient is much more likely to exercise if the caregiver / spouse does it with him/her. In general a structured, repetitive schedule is best.  General Health: Any diseases which effect your body will effect your brain such as a pneumonia, urinary infection, blood clot, heart attack or stroke. Keep contact with your primary care doctor for regular follow ups.  Sleep. A good nights sleep is healthy for the brain. Seven hours is recommended. If you have insomnia or poor sleep habits we can give you some instructions. If you have sleep apnea wear your mask.  Diet: Eating a heart healthy diet is also a good idea; fish and poultry instead of red meat, nuts (mostly non-peanuts), vegetables, fruits, olive oil or canola oil (instead of butter), minimal salt (use other spices to flavor foods), whole grain rice, bread, cereal and pasta and wine in moderation.Research is now showing that the MIND diet, which is a combination of The Mediterranean diet and the DASH diet, is beneficial for cognitive processing and longevity. Information about this diet can be found in The MIND Diet, a book by Annitta Feeling, MS, RDN, and  online at [employer].es  Finances, Power of [address] and Advance Directives: You should consider putting legal safeguards in place with regard to financial and medical decision making. While the spouse always has power of attorney for medical and financial issues in the absence of any form, you should consider what you want in case the spouse / caregiver is no longer around or capable of making decisions.

## 2024-08-05 NOTE — Progress Notes (Signed)
 [facility]  PATIENT: Pedro Palmer DOB: 1933/10/02  REQUESTING CLINICIAN: Rexanne Ingle, MD HISTORY FROM: Patient/Spouse  REASON FOR VISIT: Memory Loss    HISTORICAL  CHIEF COMPLAINT:  Chief Complaint  Patient presents with   New Patient (Initial Visit)    Rm 13, with wife, memory concens    HISTORY OF PRESENT ILLNESS:  Discussed the use of AI scribe software for clinical note transcription with the patient, who gave verbal consent to proceed.  Pedro Palmer is an 89 year old male with history of hypertension, hyperlipidemia, BHP, memory loss who presents with progressive memory loss over the past few years and getting worse in the past 6 to 8 months. He is accompanied by his wife.  Over the past 6 to 8 months, he has experienced progressive memory loss. Initially sporadic, the memory issues have become more consistent. He often forgets tasks when given multiple instructions and struggles to remember plans made the previous day. He sometimes forgets the year and has difficulty recalling tasks or instructions.  Approximately a year and a half ago, he drove to the wrong location, ending up on the other side of [place], despite being familiar with the route. Since then, he has not driven a car. He occasionally rides a motorcycle with a friend but does not ride alone. His wife manages the household bills, and he does not cook or clean.  He has no difficulty with self-care activities such as showering or dressing, although his wife helps ensure his clothes match. He enjoys spending time outdoors and dislikes being inside, regardless of the weather. He has a good appetite and eats well when meals are prepared for him.  He has no history of stroke, seizures, traumatic brain injury, depression, anxiety, or sleep apnea. There is no known family history of dementia. He has a twin brother who passed away 15 to 20 years ago, and his mother died when he was three days old.  He was raised by his grandmother.  He currently takes medication for prostate issues at night and a cholesterol medication. He has difficulty using a cell phone or electronics, and his wife advises him not to answer calls from unknown numbers due to potential scams.    TBI:   No past history of TBI Stroke:   no past history of stroke Seizures:   no past history of seizures Sleep:  no history of sleep apnea.   Mood:  patient denies anxiety and depression Family history of Dementia:  Denies  Functional status: independent in most ADLs and IADLs Patient lives with spouse. Cooking: no Cleaning: no Shopping: no Bathing: yes, no issues Toileting: no issues  Driving: no but rode his motorcycle last in June  Bills: spouse Medications: spouse  Ever left the stove on by accident?: denies Forget how to use items around the house?: yes Getting lost going to familiar places?: yes Forgetting loved ones names?: denies Word finding difficulty? yes Sleep: good    OTHER MEDICAL CONDITIONS: Hypertension, Hyperlipidemia, BPH, Memory loss    REVIEW OF SYSTEMS: Full 14 system review of systems performed and negative with exception of: As noted in the HPI   ALLERGIES: No Known Allergies  HOME MEDICATIONS: Outpatient Medications Prior to Visit  Medication Sig Dispense Refill   amLODipine  (NORVASC ) 10 MG tablet Take 1 tablet (10 mg total) by mouth daily. 90 tablet 0   Ascorbic Acid  (VITAMIN C  PO) Take 1,000 mg by mouth daily.      ASPIRIN  81 PO 1 tablet  aspirin  EC 81 MG tablet Take 81 mg by mouth daily.     docusate sodium  (STOOL SOFTENER) 100 MG capsule 1 capsule as needed     lisinopril  (ZESTRIL ) 20 MG tablet 1 tablet     Multiple Vitamin (MULTIVITAMIN ADULT PO) 1 tablet     Multiple Vitamin (MULTIVITAMIN WITH MINERALS) TABS tablet Take 1 tablet by mouth daily.     oxybutynin  (DITROPAN ) 5 MG tablet Take 5 mg by mouth 3 (three) times daily.     simvastatin  (ZOCOR ) 20 MG tablet Take 1  tablet by mouth every evening.     tamsulosin  (FLOMAX ) 0.4 MG CAPS capsule 1 capsule     traMADol  (ULTRAM ) 50 MG tablet Take 50 mg by mouth daily as needed.     amLODipine  (NORVASC ) 10 MG tablet Take 1 tablet by mouth daily. (Patient not taking: Reported on 08/05/2024)     azelastine  (ASTELIN ) 0.1 % nasal spray Place 1 spray into both nostrils 2 (two) times daily. Use in each nostril as directed (Patient not taking: Reported on 08/05/2024) 30 mL 0   cetirizine  (ZYRTEC ) 5 MG tablet Take 1 tablet (5 mg total) by mouth daily. (Patient not taking: Reported on 08/05/2024) 14 tablet 0   meloxicam (MOBIC) 15 MG tablet 1 tablet (Patient not taking: Reported on 08/05/2024)     Miconazole  Nitrate 2 % AERO Spray between toes once daily. 150 g 0   simvastatin  (ZOCOR ) 20 MG tablet Take 20 mg by mouth every evening.      traMADol  (ULTRAM ) 50 MG tablet 1 tablet     No facility-administered medications prior to visit.    PAST MEDICAL HISTORY: Past Medical History:  Diagnosis Date   Arthritis    Colon cancer (HCC) dx'd 04/2010   Hypercholesteremia    Hypertension    Paget disease of bone    Pulmonary emboli (HCC) 2011 or 2012    PAST SURGICAL HISTORY: Past Surgical History:  Procedure Laterality Date   APPENDECTOMY     COLONOSCOPY WITH PROPOFOL  N/A 07/25/2014   Procedure: COLONOSCOPY WITH PROPOFOL ;  Surgeon: Gladis MARLA Louder, MD;  Location: [facility];  Service: Endoscopy;  Laterality: N/A;   HEMORRHOID SURGERY  1994   KELOID EXCISION     LEFT COLECTOMY  07/03/2010   TRANSURETHRAL RESECTION OF PROSTATE N/A 11/17/2019   Procedure: TRANSURETHRAL RESECTION OF THE PROSTATE (TURP), BIPOLAR;  Surgeon: Devere Lonni Righter, MD;  [facility];  Service: Urology;  Laterality: N/A;    FAMILY HISTORY: History reviewed. No pertinent family history.  SOCIAL HISTORY: Social History   Socioeconomic History   Marital status: Widowed    Spouse name: Not on file   Number of  children: Not on file   Years of education: Not on file   Highest education level: Not on file  Occupational History   Not on file  Tobacco Use   Smoking status: Never   Smokeless tobacco: Never  Vaping Use   Vaping status: Never Used  Substance and Sexual Activity   Alcohol use: No   Drug use: No   Sexual activity: Not on file  Other Topics Concern   Not on file  Social History Narrative   Right handed   Caffeine-none   Lives with wife   Social Drivers of Health   Financial Resource Strain: Not on file  Food Insecurity: Not on file  Transportation Needs: Not on file  Physical Activity: Not on file  Stress: Not on file  Social Connections:  Not on file  Intimate Partner Violence: Not on file    PHYSICAL EXAM  GENERAL EXAM/CONSTITUTIONAL: Vitals:  Vitals:   08/05/24 1309  BP: 138/84  Pulse: 89  SpO2: 99%   There is no height or weight on file to calculate BMI. Wt Readings from Last 3 Encounters:  03/23/24 170 lb (77.1 kg)  11/17/19 170 lb 9 oz (77.4 kg)  11/15/19 168 lb (76.2 kg)   Patient is in no distress; well developed, nourished and groomed; neck is supple  MUSCULOSKELETAL: Gait, strength, tone, movements noted in Neurologic exam below  NEUROLOGIC: MENTAL STATUS:     08/05/2024    1:13 PM  MMSE - Mini Mental State Exam  Not completed: Unable to complete   awake, alert Unable to perform MMSE   CRANIAL NERVE:  2nd, 3rd, 4th, 6th- visual fields full to confrontation, extraocular muscles intact, no nystagmus 5th - facial sensation symmetric 7th - facial strength symmetric 8th - hearing intact 9th - palate elevates symmetrically, uvula midline 11th - shoulder shrug symmetric 12th - tongue protrusion midline  MOTOR:  normal bulk and tone, full strength in the BUE, BLE  SENSORY:  normal and symmetric to light touch  COORDINATION:  finger-nose-finger, fine finger movements normal  GAIT/STATION:  normal    DIAGNOSTIC DATA (LABS, IMAGING,  TESTING) - I reviewed patient records, labs, notes, testing and imaging myself where available.  Lab Results  Component Value Date   WBC 6.8 11/15/2019   HGB 15.3 11/15/2019   HCT 46.6 11/15/2019   MCV 101.3 (H) 11/15/2019   PLT 201 11/15/2019      Component Value Date/Time   NA 142 11/15/2019 1511   NA 144 01/18/2015 1429   K 4.0 11/15/2019 1511   K 3.9 01/18/2015 1429   CL 106 11/15/2019 1511   CL 103 01/18/2013 0912   CO2 27 11/15/2019 1511   CO2 30 (H) 01/18/2015 1429   GLUCOSE 128 (H) 11/15/2019 1511   GLUCOSE 90 01/18/2015 1429   GLUCOSE 88 01/18/2013 0912   BUN 19 11/15/2019 1511   BUN 12.7 01/18/2015 1429   CREATININE 1.08 11/15/2019 1511   CREATININE 0.9 01/18/2015 1429   CALCIUM  10.6 (H) 11/15/2019 1511   CALCIUM  10.3 01/18/2015 1429   PROT 7.2 08/28/2019 0538   PROT 7.2 01/12/2014 1300   ALBUMIN 3.8 08/28/2019 0538   ALBUMIN 4.4 01/12/2014 1300   AST 40 08/28/2019 0538   AST 19 01/12/2014 1300   ALT 10 08/28/2019 0538   ALT 19 01/12/2014 1300   ALKPHOS 188 (H) 08/28/2019 0538   ALKPHOS 332 (H) 01/12/2014 1300   BILITOT 1.7 (H) 08/28/2019 0538   BILITOT 0.60 01/12/2014 1300   GFRNONAA >60 11/15/2019 1511   GFRAA >60 11/15/2019 1511   No results found for: CHOL, HDL, LDLCALC, LDLDIRECT, TRIG, CHOLHDL No results found for: YHAJ8R No results found for: VITAMINB12 No results found for: TSH    ASSESSMENT AND PLAN  89 y.o. year old male with hypertension, hyperlipidemia, BPH, who is presenting with memory loss for the past week.  Is getting worse in the past year.  Mild  Alzheimer's disease dementia Progressive memory decline over the past 6 to 8 months, consistent with Mild Alzheimer's disease dementia. Symptoms include difficulty remembering recent events, inability to recall instructions, and disorientation with time. He has not been driving since an incident a year and a half ago where he became lost. He is unable to perform the MMSE  today. He is still  able to perform self-care activities independently. There is no family history of dementia. The condition is progressive, and there are no medications to stop or reverse it, only to slow progression. - Order blood tests to assess current health status. - Schedule head CT in 1-2 weeks to evaluate brain structure. - Initiate Aricept  5 mg at nighttime to slow disease progression. - Monitor for side effects, especially when using stairs. - Increase Aricept  to 10 mg if tolerated. - Advise to stop Aricept  if significant side effects occur. - Continue to follow with primary care physician for medication refills.     1. 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  Exercise Daily Walk A daily 20 minute walk should be part of your routine. Disease related apathy can be a significant roadblock to exercise and the only way to overcome this is to make it a daily routine and perhaps have a reward at the end (something your loved one loves to eat or drink perhaps) or a personal trainer coming to the home can also be very useful. Most importantly, the patient is much more likely to exercise if the caregiver / spouse does it with him/her. In general a structured, repetitive schedule is best.  General Health: Any diseases which effect your body will effect your brain such as a pneumonia, urinary infection, blood clot, heart attack or stroke. Keep contact with your  primary care doctor for regular follow ups.  Sleep. A good nights sleep is healthy for the brain. Seven hours is recommended. If you have insomnia or poor sleep habits we can give you some instructions. If you have sleep apnea wear your mask.  Diet: Eating a heart healthy diet is also a good idea; fish and poultry instead of red meat, nuts (mostly non-peanuts), vegetables, fruits, olive oil or canola oil (instead of butter), minimal salt (use other spices to flavor foods), whole grain rice, bread, cereal and pasta and wine in moderation.Research is now showing that the MIND diet, which is a combination of The Mediterranean diet and the DASH diet, is beneficial for cognitive processing and longevity. Information about this diet can be found in The MIND Diet, a book by Annitta Feeling, MS, RDN, and online at [employer]  Finances, Power of [address] and Advance Directives: You should consider putting legal safeguards in place with regard to financial and medical decision making. While the spouse always has power of attorney for medical and financial issues in the absence of any form, you should consider what you want in case the spouse / caregiver is no longer around or capable of making decisions.   Orders Placed This Encounter  Procedures   CT HEAD WO CONTRAST ( )   ATN PROFILE   Vitamin B12   TSH    Meds ordered this encounter  Medications   donepezil  (ARICEPT ) 5 MG tablet    Sig: Take 1 tablet (5 mg total) by mouth at bedtime.    Dispense:  30 tablet    Refill:  3  Return if symptoms worsen or fail to improve.  I personally spent a total of 60 minutes in the care of the patient today including preparing to see the patient, getting/reviewing separately obtained history, performing a medically appropriate exam/evaluation, counseling and educating, placing orders, and documenting clinical information in the EHR.   Pastor Falling, MD 08/05/2024, 1:46  PM  [facility] [address] [place], [place] [address] [phone]

## 2024-08-08 LAB — ATN PROFILE
A -- Beta-amyloid 42/40 Ratio: 0.095 — AB (ref >0.102)
Beta-amyloid 40: 208.59 pg/mL
Beta-amyloid 42: 19.86 pg/mL
N -- NfL, Plasma: 3.31 pg/mL (ref 0.00–9.13)
T -- p-tau181: 0.7 pg/mL (ref 0.00–0.97)

## 2024-08-08 LAB — TSH: TSH: 1.49 u[IU]/mL (ref 0.450–4.500)

## 2024-08-08 LAB — VITAMIN B12: Vitamin B-12: 962 pg/mL (ref 232–1245)

## 2024-08-10 ENCOUNTER — Telehealth: Payer: Self-pay | Admitting: Neurology

## 2024-08-10 ENCOUNTER — Ambulatory Visit: Payer: Self-pay | Admitting: Neurology

## 2024-08-10 NOTE — Telephone Encounter (Signed)
 No auth required sent to [facility] to schedule. [phone]

## 2024-08-10 NOTE — Progress Notes (Signed)
 Please call and advise the patient that the recent labs we checked showed possible presence of Alzheimer disease biomarker. Please inform patient/spouse that the memory loss is possibly due to Alzheimer disease. Please advised them to take the medications as discussed.  Please remind patient to keep any upcoming appointments or tests and to call us  with any interim questions, concerns, problems or updates. Thanks,   Pastor Falling, MD

## 2024-10-20 ENCOUNTER — Encounter: Payer: Self-pay | Admitting: Podiatry

## 2024-10-20 ENCOUNTER — Ambulatory Visit: Admitting: Podiatry

## 2024-10-20 DIAGNOSIS — B351 Tinea unguium: Secondary | ICD-10-CM | POA: Diagnosis not present

## 2024-10-20 DIAGNOSIS — M79675 Pain in left toe(s): Secondary | ICD-10-CM

## 2024-10-20 DIAGNOSIS — M79674 Pain in right toe(s): Secondary | ICD-10-CM

## 2024-10-20 DIAGNOSIS — E1169 Type 2 diabetes mellitus with other specified complication: Secondary | ICD-10-CM

## 2024-10-30 NOTE — Progress Notes (Signed)
  Subjective:  Patient ID: Pedro Palmer, male    DOB: 10-Jan-1933,  MRN: [id number]  Pedro Palmer presents to clinic today for preventative diabetic foot care for painful elongated mycotic toenails 1-5 bilaterally which are tender when wearing enclosed shoe gear. Pain is relieved with periodic professional debridement. He is accompanied by his wife on today's visit. Chief Complaint  Patient presents with   RFC     RFC Non diabetic tenail trim. LOV with PCP 09/2024.   New problem(s): None.   PCP is Rexanne Ingle, MD.  No Known Allergies  Review of Systems: Negative except as noted in the HPI.  Objective: No changes noted in today's physical examination. There were no vitals filed for this visit. Pedro Palmer is a pleasant 89 y.o. male thin build in NAD. AAO x 3.  Vascular Examination: Capillary refill time immediate b/l. Palpable pedal pulses. Pedal hair absent b/l. Trace edema b/l. No pain with calf compression b/l. Skin temperature gradient WNL b/l. No cyanosis or clubbing b/l. No ischemia or gangrene noted b/l.   Neurological Examination: Sensation grossly intact b/l with 10 gram monofilament. Vibratory sensation intact b/l.   Dermatological Examination: Pedal skin with normal turgor, texture and tone b/l.  No open wounds. No interdigital macerations.   Toenails 1-5 b/l thick, discolored, elongated with subungual debris and pain on dorsal palpation.   No corns, calluses, nor porokeratotic lesions.  Musculoskeletal Examination: Muscle strength 5/5 to all lower extremity muscle groups bilaterally. No pain, crepitus or joint limitation noted with ROM b/l LE. No gross bony pedal deformities b/l. Patient ambulates independently without assistive aids.  Radiographs: None  Assessment/Plan: 1. Pain due to onychomycosis of toenails of both feet   2. Type 2 diabetes mellitus with other specified complication, without long-term current use of insulin [facility])   Consent given for  treatment. Patient examined. All patient's and/or POA's questions/concerns addressed on today's visit. Toenails 1-5 b/l debrided in length and girth without incident. Continue foot and shoe inspections daily. Monitor blood glucose per PCP/Endocrinologist's recommendations. Continue soft, supportive shoe gear daily. Report any pedal injuries to medical professional. Call office if there are any questions/concerns. -Patient/POA to call should there be question/concern in the interim.   Return in about 3 months (around 01/20/2025).  Pedro Palmer, DPM      [place] LOCATION: [address], [place] [address]                   Office [phone]   [facility] LOCATION: [address] [place], [place] [address] Office [phone]

## 2024-12-07 ENCOUNTER — Other Ambulatory Visit: Payer: Self-pay | Admitting: Neurology

## 2025-02-02 ENCOUNTER — Ambulatory Visit: Admitting: Podiatry
